# Patient Record
Sex: Female | Born: 1975 | Race: Black or African American | Hispanic: No | Smoking: Never smoker
Health system: Southern US, Community
[De-identification: ages and names within clinical notes are randomized; demographics above are authoritative.]

## PROBLEM LIST (undated history)

## (undated) ENCOUNTER — Inpatient Hospital Stay (HOSPITAL_COMMUNITY): Payer: Self-pay

## (undated) DIAGNOSIS — E282 Polycystic ovarian syndrome: Secondary | ICD-10-CM

## (undated) DIAGNOSIS — M1990 Unspecified osteoarthritis, unspecified site: Secondary | ICD-10-CM

## (undated) DIAGNOSIS — M5126 Other intervertebral disc displacement, lumbar region: Secondary | ICD-10-CM

## (undated) DIAGNOSIS — I10 Essential (primary) hypertension: Secondary | ICD-10-CM

## (undated) DIAGNOSIS — E119 Type 2 diabetes mellitus without complications: Secondary | ICD-10-CM

## (undated) DIAGNOSIS — Z5189 Encounter for other specified aftercare: Secondary | ICD-10-CM

## (undated) DIAGNOSIS — G5600 Carpal tunnel syndrome, unspecified upper limb: Secondary | ICD-10-CM

## (undated) HISTORY — PX: WISDOM TOOTH EXTRACTION: SHX21

---

## 2015-04-16 ENCOUNTER — Emergency Department (HOSPITAL_COMMUNITY)
Admission: EM | Admit: 2015-04-16 | Discharge: 2015-04-16 | Disposition: A | Discharge status: Home / Self Care | Payer: Self-pay | Attending: Emergency Medicine | Admitting: Emergency Medicine

## 2015-04-16 ENCOUNTER — Encounter (HOSPITAL_COMMUNITY): Payer: Self-pay | Admitting: *Deleted

## 2015-04-16 DIAGNOSIS — M79604 Pain in right leg: Secondary | ICD-10-CM

## 2015-04-16 DIAGNOSIS — I10 Essential (primary) hypertension: Secondary | ICD-10-CM | POA: Insufficient documentation

## 2015-04-16 DIAGNOSIS — E119 Type 2 diabetes mellitus without complications: Secondary | ICD-10-CM | POA: Insufficient documentation

## 2015-04-16 DIAGNOSIS — Z8669 Personal history of other diseases of the nervous system and sense organs: Secondary | ICD-10-CM | POA: Insufficient documentation

## 2015-04-16 DIAGNOSIS — M79661 Pain in right lower leg: Secondary | ICD-10-CM | POA: Insufficient documentation

## 2015-04-16 HISTORY — DX: Unspecified osteoarthritis, unspecified site: M19.90

## 2015-04-16 HISTORY — DX: Polycystic ovarian syndrome: E28.2

## 2015-04-16 HISTORY — DX: Essential (primary) hypertension: I10

## 2015-04-16 HISTORY — DX: Type 2 diabetes mellitus without complications: E11.9

## 2015-04-16 HISTORY — DX: Carpal tunnel syndrome, unspecified upper limb: G56.00

## 2015-04-16 HISTORY — DX: Other intervertebral disc displacement, lumbar region: M51.26

## 2015-04-16 HISTORY — DX: Encounter for other specified aftercare: Z51.89

## 2015-04-16 MED ORDER — HYDROCODONE-ACETAMINOPHEN 5-325 MG PO TABS: 1.0000 | ORAL_TABLET | Freq: Four times a day (QID) | ORAL | Status: DC | PRN

## 2015-04-16 MED ORDER — HYDROCODONE-ACETAMINOPHEN 5-325 MG PO TABS
1.0000 | ORAL_TABLET | Freq: Once | ORAL | Status: AC
  Administered 2015-04-16: 1 via ORAL
  Filled 2015-04-16: qty 1

## 2015-04-16 MED ORDER — HYDROCHLOROTHIAZIDE 25 MG PO TABS: 25.0000 mg | ORAL_TABLET | Freq: Every day | ORAL | Status: DC

## 2015-04-16 MED ORDER — ENOXAPARIN SODIUM 120 MG/0.8ML ~~LOC~~ SOLN
1.0000 mg/kg | Freq: Once | SUBCUTANEOUS | Status: AC
  Administered 2015-04-16: 120 mg via SUBCUTANEOUS
  Filled 2015-04-16: qty 0.8

## 2015-04-16 NOTE — ED Notes (Signed)
Pt stable, ambulatory, pain decreased to 4/10, states understanding of discharge instructions 

## 2015-04-16 NOTE — Discharge Instructions (Signed)
Please return to ER tomorrow for vascular doppler study of your right leg to rule out blood clot in your leg.  Use resources below to find a primary care provider for further management of your health.  Your blood pressure is high, take hydrochlorothiazide as prescribed and have it rechecked.     Emergency Department Resource Guide 1) Find a Doctor and Pay Out of Pocket Although you won't have to find out who is covered by your insurance plan, it is a good idea to ask around and get recommendations. You will then need to call the office and see if the doctor you have chosen will accept you as a new patient and what types of options they offer for patients who are self-pay. Some doctors offer discounts or will set up payment plans for their patients who do not have insurance, but you will need to ask so you aren't surprised when you get to your appointment.  2) Contact Your Local Health Department Not all health departments have doctors that can see patients for sick visits, but many do, so it is worth a call to see if yours does. If you don't know where your local health department is, you can check in your phone book. The CDC also has a tool to help you locate your state's health department, and many state websites also have listings of all of their local health departments.  3) Find a Walk-in Clinic If your illness is not likely to be very severe or complicated, you may want to try a walk in clinic. These are popping up all over the country in pharmacies, drugstores, and shopping centers. They're usually staffed by nurse practitioners or physician assistants that have been trained to treat common illnesses and complaints. They're usually fairly quick and inexpensive. However, if you have serious medical issues or chronic medical problems, these are probably not your best option.  No Primary Care Doctor: - Call Health Connect at  [phone] - they can help you locate a primary care doctor that  accepts  your insurance, provides certain services, etc. - Physician Referral Service- [phone]  Chronic Pain Problems: Organization         Address  Phone   Notes  [facility]  [phone] Patients need to be referred by their primary care doctor.   Medication Assistance: Organization         Address  Phone   Notes  [facility] [address] [place], [place] [address] [phone] --Must be a resident of [place] -- Must have NO insurance coverage whatsoever (no Medicaid/ Medicare, etc.) -- The pt. MUST have a primary care doctor that directs their care regularly and follows them in the community   MedAssist  [phone]   [employer]  [phone]    Agencies that provide inexpensive medical care: Organization         Address  Phone   Notes  [facility]  [phone]   [facility]    [phone]) [phone]   [facility] [address] [place], [place] [address] [phone]   [facility] [address], [place] [phone]) [phone]   Planned Parenthood    [phone]   [facility]    [phone]   [facility]  [address], [place] Phone:  [phone], Fax:  [phone] Hours of  Operation:  9 am - 6 pm, M-F.  Also accepts Medicaid/Medicare and self-pay.  [facility] for [place], [address], [place] Phone: [phone], Fax: [phone]. Hours of Operation:  8:30 am - 5:30 pm, M-F.  Also accepts Medicaid and self-pay.  [facility] [address], [place] Phone: [phone]   [place], [place], [place] [phone], Ext. [phone] Mondays & Thursdays: 7-9 AM.  First 15 patients are seen on a first come, first serve basis.    [place] Providers:  Organization          Address  Phone   Notes  [facility] [address], [place] [phone] Also accepts self-pay patients.  [facility] [address], [place]  [phone]   [place], [address], [place] [phone]   [facility] [address] [place] [phone]   Lucianne Lei [address], [place]   [phone] Only accepts [place] Access [place] patients after they have their name applied to their card.   Self-Pay (no insurance) in [facility]:  Organization         Address  Phone   Notes  Sickle Cell Patients, [facility] [place] [phone]   [facility] [place] [phone]   [facility] [place]  [place], [place], [place] [phone]   [facility]/Dr. Osei-Bonsu  [address] [place] or [address], [place] [phone] Phone number for both [place] and [place] locations is the same.  [facility] [address] [place] [phone]   [facility] [address], [place] or [address] [phone]   [facility] [address], [place] [phone], phone; [phone], fax Sees patients 1st and 3rd Saturday of every month.  Must not qualify for public or private insurance (i.e. Medicaid, Medicare, [place] Health Choice, Veterans' Benefits)  Household income should be no more than 200% of the poverty level The clinic cannot treat you if you are pregnant or think you are pregnant  Sexually transmitted diseases are not treated at the clinic.    Dental Care: Organization         Address  Phone  Notes  [facility] Department of [facility] [place] [phone] Accepts children up to age 47 who are enrolled in [place] or [place]; pregnant women with a Medicaid card; and children who have applied for Medicaid or [place] Health Choice, but were declined, whose parents can pay a reduced fee at time of service.  [facility] Department of [facility]  [address], [place] [phone] Accepts children up to age 82 who are enrolled in [place] or [place]; pregnant women with a Medicaid card; and children who have applied for Medicaid or [place] Health Choice, but were declined, whose parents can pay a reduced fee at time of service.  [facility] Adult [facility] Access PROGRAM  [place] [phone] Patients are seen by appointment only. Walk-ins are not accepted. [place] will see patients  36 years of age and older. Monday - Tuesday (8am-5pm) Most Wednesdays (8:30-5pm) $30 per visit, cash only  [facility] Adult Dental Access PROGRAM  [address], [facility] [phone] Patients are seen by appointment only. Walk-ins are not accepted. [place] will see patients 34 years of age and older. One Wednesday Evening (Monthly: Volunteer Based).  $30 per visit, cash only  [place]  [phone] for adults; Children under age 73, call Graduate Pediatric Dentistry at [phone]. Children aged 46-14, please call [phone] to request a pediatric application.  Dental services are provided in all areas of dental care including fillings, crowns and bridges, complete and partial dentures, implants, gum treatment, root canals, and extractions. Preventive care is also provided. Treatment is provided to both adults and children. Patients are selected via a lottery and there is often a waiting list.   [facility] [address] [place]  [phone] [email or web address]   [facility] [address] [place], [place]  [phone], Ext. [phone] Second and Fourth Thursday of each month, opens at 6:30 AM; Clinic ends at 9 AM.  Patients are seen on a first-come first-served basis, and a limited number are seen during each clinic.   [facility]  [address] [place]ard Danker [place], [place] [phone]   Eligibility Requirements You must have lived in [place], [place], or [place] counties for at least the last three months.   You cannot be eligible for state or federal sponsored [employer], including [employer], [place], or [employer].   You generally cannot be eligible for healthcare insurance through your employer.    How to apply: Eligibility screenings are held every Tuesday and Wednesday afternoon from 1:00 pm until 4:00 pm. You do not need an appointment for the interview!  [facility] [address] [place], [place]   [place]  [place] Department  [place]  [phone]    Behavioral Health Resources in the Community: Intensive Outpatient Programs Organization         Address  Phone  Notes  [facility] [place]. [address] [place], [place] [phone]   [facility] Outpatient [address], [place], [place]   ADS: Alcohol & Drug Svcs [address], [place], [place]   [place] [address]  [place], [place] or [phone]   Substance Abuse Resources Organization         Address  Phone  Notes  Alcohol and Drug Services  [phone]   [place]  [phone]   The [place]   Chinita Pester  [phone]   Residential & Outpatient Substance Abuse Program  [phone]   Psychological Services Organization         Address  Phone  Notes  [facility] [place]  [place]  [phone]    [place] [address], [place] or [phone]    Mobile Crisis Teams Organization         Address  Phone  Notes  [facility], Mobile Crisis Care Unit  [phone]   [facility]  [address] [place], [place]   [facility] [address] [place] [phone]    Self-Help/Support Groups Organization  Address  Phone             Notes  [place]. of [place] - variety of support groups  [place] Call for more information  Narcotics Anonymous (NA), [facility] [address], [employer] [place]  2 meetings at this location   Special educational needs teacher         Address  Phone  Notes  [facility]idential [facility] [place],    [place]  [phone]   [facility]  [address] [place] [address], [place], [place]   [place], [place] [phone] Admissions: 8am-3pm M-F  Incentives Substance [place] [address],    [place], [place] [phone]   [facility] [address] [place], [place], [place]   [facility] [address]  [place], [place]   Insight Programs - Intensive Outpatient [address], [place], [place]   [facility] ([place].,  [place], [place] [phone] or [phone]   [facility] Services (RTS) [address] [place], [place] Accepts Medicaid  [facility] [place] [address]  [place] [phone] Substance Abuse/Addiction Treatment   [facility] Organization         Address  Phone  Notes  [facility]  [phone]   Domenic Schwab, PhD [address] [place]lis [place], [place]   [phone] or [phone]   [place]  [place], [place] [phone]   [facility] [address] [place], [place] [phone] Insurance/Medicaid/sponsorship through [facility] and Families [address] [place]                                    [place], [place] [phone] [place] [address], [place] [phone]    Dr. Adele Schilder  [phone]   [facility] [place] Dept. 1) [address], [place] 2) [place] 3)  [place] [address], [place] [phone]  [phone]   [place] [phone] or [phone] (After Hours)

## 2015-04-16 NOTE — ED Notes (Signed)
Called for BP recheck. No answer.

## 2015-04-16 NOTE — ED Notes (Signed)
Pt states that she "feels something warm running down the inside of my right leg." pt also c/o knot on her head x 2 years. States that it is getting bigger. Pt states she is concerned for a blood clot in her leg.

## 2015-04-16 NOTE — ED Notes (Signed)
Also c/o left hip pain at times.

## 2015-04-16 NOTE — ED Provider Notes (Signed)
CSN: [id number]     Arrival date & time 04/16/15  1726 History   First MD Initiated Contact with Patient 04/16/15 2023     Chief Complaint  Patient presents with  . Leg Pain     (Consider location/radiation/quality/duration/timing/severity/associated sxs/prior Treatment) HPI   39 year old female with history of PCOS, diabetes, hypertension presenting for evaluation of R leg pain.  Patient complaining of recurrent pain to her right posterior knee and right calf for the past 3 months. Pain is described as a dull achy sensation with occasional sharp pain. She also endorsed intermittent numbness sensation to her knee and leg. She felt that for the past 5 days her pain has increased in intensity. Occasionally patient report "I feel something while running down the inside of my leg and I am concerning of blood clot". She she travels extensively, driving all around town and occasionally driving long distances. Report that she had a 15 Hour drive last week. She also have history of diabetes and hypertension which she does not take any medication for. Reported history of arthritis, degenerative disc disease and back pain. She has not tried anything to help with her symptoms. She never had blood clot in her legs before however she is overly concerned about clots because a friend of her died from a blood clot recently.  She also mentioned she is scheduled for a long trip (40 hrs drive) in the next few days and want to ensure she doesn't have blood clot.  Denies recent surgery, prolonged bed rest, active cancer, increase SOB, or hemoptysis.    Further more pt has a knot on the top of her scalp that has increased in size for the past several years.  It is occasionally painful.  Denies severe headache, fever, or rash.  Denies any injury.  sts knot changes size (sometimes large then became small).     Past Medical History  Diagnosis Date  . Blood transfusion without reported diagnosis   . Diabetes mellitus  without complication   . Hypertension   . Arthritis   . Lumbar herniated disc   . DJD (degenerative joint disease)   . PCOS (polycystic ovarian syndrome)   . Carpal tunnel syndrome    History reviewed. No pertinent past surgical history. No family history on file. History  Substance Use Topics  . Smoking status: Never Smoker   . Smokeless tobacco: Not on file  . Alcohol Use: No   OB History    No data available     Review of Systems  All other systems reviewed and are negative.     Allergies  Lyrica; Ultracet; and Ultram  Home Medications   Prior to Admission medications   Not on File   BP 206/114 mmHg  Pulse 78  Temp(Src) 98.4 F (36.9 C)  Resp 18  Wt 265 lb 4 oz (120.317 kg)  SpO2 97%  LMP 03/26/2015 Physical Exam  Constitutional: She is oriented to person, place, and time. She appears well-developed and well-nourished. No distress.  Moderately obese American female, nontoxic in appearance  HENT:  Head: Atraumatic.  A quarter size non-mobile nodule noted to the vertex of scalp, no surrounding erythema and erythema, mild tenderness to palpation. No alopecia.  Eyes: Conjunctivae are normal.  Neck: Neck supple.  Cardiovascular: Normal rate and regular rhythm.   Pulmonary/Chest: Effort normal and breath sounds normal.  Abdominal: Soft. There is no tenderness.  Musculoskeletal: She exhibits tenderness (right lower extremity: Mild tenderness to popliteal fossa and right calf  without evidence of Baker's cyst, edema, palpable cord, or erythema. Normal knee flexion and extension. Intact distal pedal pulses.).  Neurological: She is alert and oriented to person, place, and time. She has normal strength. No cranial nerve deficit or sensory deficit. She displays a negative Romberg sign. Coordination and gait normal. GCS eye subscore is 4. GCS verbal subscore is 5. GCS motor subscore is 6.  Skin: No rash noted.  Psychiatric: She has a normal mood and affect.  Nursing note  and vitals reviewed.   ED Course  Procedures (including critical care time)  Patient here with concern of right knee and right leg pain that has been persistent for at least 3 months. She is concerning of possible DVT due to prolonged travel on a regular basis. On exam patient has no significant finding to suggest DVT. Although I have low suspicion for DVT patient is very concerned because of a friend recently died from a blood clot that was undiagnosed. Given her risk factors, vascular Doppler ultrasound will be ordered for tomorrow. Patient will receive Lovenox tonight. She also has elevated blood pressure with systolic above 200. She has been noncompliant with her medication and therefore I'll prescribe hydrochlorothiazide and provide results of resource for patient to follow-up closely. Return precautions discussed.  Labs Review Labs Reviewed - No data to display  Imaging Review No results found.   EKG Interpretation None      MDM   Final diagnoses:  Right leg pain    BP 206/110 mmHg  Pulse 50  Temp(Src) 98.4 F (36.9 C) (Oral)  Resp 17  Ht 5\' 10"  (1.778 m)  Wt 265 lb 4 oz (120.317 kg)  BMI 38.06 kg/m2  SpO2 100%  LMP 03/26/2015     Fayrene HelperBowie Jmya Uliano, PA-C 04/16/15 2259  Benjiman CoreNathan Pickering, MD 04/19/15 [phone]

## 2015-04-17 ENCOUNTER — Ambulatory Visit (HOSPITAL_COMMUNITY)
Admission: RE | Admit: 2015-04-17 | Discharge: 2015-04-17 | Disposition: A | Discharge status: Home / Self Care | Payer: Self-pay | Source: Ambulatory Visit | Attending: Emergency Medicine | Admitting: Emergency Medicine

## 2015-04-17 DIAGNOSIS — M79604 Pain in right leg: Secondary | ICD-10-CM | POA: Insufficient documentation

## 2015-04-17 NOTE — Progress Notes (Signed)
VASCULAR LAB PRELIMINARY  PRELIMINARY  PRELIMINARY  PRELIMINARY  Right lower extremity venous duplex completed.    Preliminary report:  Right:  No evidence of DVT, superficial thrombosis, or Baker's cyst.   Schelly Chuba, RVS 04/17/2015, 1:11 PM

## 2015-04-22 ENCOUNTER — Encounter (HOSPITAL_COMMUNITY): Payer: Self-pay | Admitting: *Deleted

## 2015-04-22 ENCOUNTER — Emergency Department (HOSPITAL_COMMUNITY)
Admission: EM | Admit: 2015-04-22 | Discharge: 2015-04-22 | Disposition: A | Discharge status: Home / Self Care | Payer: Self-pay | Attending: Emergency Medicine | Admitting: Emergency Medicine

## 2015-04-22 DIAGNOSIS — M1990 Unspecified osteoarthritis, unspecified site: Secondary | ICD-10-CM | POA: Insufficient documentation

## 2015-04-22 DIAGNOSIS — I10 Essential (primary) hypertension: Secondary | ICD-10-CM | POA: Insufficient documentation

## 2015-04-22 DIAGNOSIS — T148XXA Other injury of unspecified body region, initial encounter: Secondary | ICD-10-CM

## 2015-04-22 DIAGNOSIS — Y848 Other medical procedures as the cause of abnormal reaction of the patient, or of later complication, without mention of misadventure at the time of the procedure: Secondary | ICD-10-CM | POA: Insufficient documentation

## 2015-04-22 DIAGNOSIS — L0291 Cutaneous abscess, unspecified: Secondary | ICD-10-CM

## 2015-04-22 DIAGNOSIS — Z79899 Other long term (current) drug therapy: Secondary | ICD-10-CM | POA: Insufficient documentation

## 2015-04-22 DIAGNOSIS — T8089XA Other complications following infusion, transfusion and therapeutic injection, initial encounter: Secondary | ICD-10-CM | POA: Insufficient documentation

## 2015-04-22 DIAGNOSIS — Z8669 Personal history of other diseases of the nervous system and sense organs: Secondary | ICD-10-CM | POA: Insufficient documentation

## 2015-04-22 DIAGNOSIS — E119 Type 2 diabetes mellitus without complications: Secondary | ICD-10-CM | POA: Insufficient documentation

## 2015-04-22 LAB — BASIC METABOLIC PANEL
ANION GAP: 7 (ref 5–15)
BUN: 9 mg/dL (ref 6–20)
CO2: 27 mmol/L (ref 22–32)
Calcium: 8.8 mg/dL — ABNORMAL LOW (ref 8.9–10.3)
Chloride: 104 mmol/L (ref 101–111)
Creatinine, Ser: 0.72 mg/dL (ref 0.44–1.00)
GFR calc Af Amer: >60 mL/min (ref >60)
GFR calc non Af Amer: >60 mL/min (ref >60)
Glucose, Bld: 114 mg/dL — ABNORMAL HIGH (ref 70–99)
POTASSIUM: 4.2 mmol/L (ref 3.5–5.1)
SODIUM: 138 mmol/L (ref 135–145)

## 2015-04-22 LAB — CBC WITH DIFFERENTIAL/PLATELET
Basophils Absolute: 0 10*3/uL (ref 0.0–0.1)
Basophils Relative: 0 % (ref 0–1)
EOS ABS: 0.1 10*3/uL (ref 0.0–0.7)
EOS PCT: 1 % (ref 0–5)
HEMATOCRIT: 33.8 % — AB (ref 36.0–46.0)
HEMOGLOBIN: 11.4 g/dL — AB (ref 12.0–15.0)
LYMPHS ABS: 3 10*3/uL (ref 0.7–4.0)
Lymphocytes Relative: 43 % (ref 12–46)
MCH: 28.1 pg (ref 26.0–34.0)
MCHC: 33.7 g/dL (ref 30.0–36.0)
MCV: 83.3 fL (ref 78.0–100.0)
MONOS PCT: 5 % (ref 3–12)
Monocytes Absolute: 0.3 10*3/uL (ref 0.1–1.0)
NEUTROS PCT: 51 % (ref 43–77)
Neutro Abs: 3.6 10*3/uL (ref 1.7–7.7)
Platelets: 265 10*3/uL (ref 150–400)
RBC: 4.06 MIL/uL (ref 3.87–5.11)
RDW: 13 % (ref 11.5–15.5)
WBC: 7 10*3/uL (ref 4.0–10.5)

## 2015-04-22 MED ORDER — HYDROCODONE-ACETAMINOPHEN 5-325 MG PO TABS
1.0000 | ORAL_TABLET | Freq: Once | ORAL | Status: AC
  Administered 2015-04-22: 1 via ORAL
  Filled 2015-04-22: qty 1

## 2015-04-22 MED ORDER — HYDROCODONE-ACETAMINOPHEN 5-325 MG PO TABS: 1.0000 | ORAL_TABLET | Freq: Four times a day (QID) | ORAL | Status: DC | PRN

## 2015-04-22 MED ORDER — CEPHALEXIN 500 MG PO CAPS: ORAL_CAPSULE | ORAL | Status: DC

## 2015-04-22 MED ORDER — SULFAMETHOXAZOLE-TRIMETHOPRIM 800-160 MG PO TABS: 1.0000 | ORAL_TABLET | Freq: Two times a day (BID) | ORAL | Status: DC

## 2015-04-22 NOTE — ED Notes (Signed)
Pt reports being seen her on the 27th for a fall. Pt sates that she was given a "shot of a blood thinner" until she could have a doppler study done the next day. Pt states the she has bruising, reddness, heat and tenderness. Pt states that there is a burning sensation.

## 2015-04-22 NOTE — Discharge Instructions (Signed)
Keep area clean and dry. Apply warm compresses to affected area throughout the day. Take antibiotics until it is finished. Take norco as directed, as needed for pain but do not drive or operate machinery with pain medication use. Followup with [facility] in 7 days for wound recheck. Monitor area for signs of infection to include, but not limited to: increasing pain, redness, drainage/pus, or swelling. Return to emergency department for emergent changing or worsening symptoms.   Abscess An abscess (boil or furuncle) is an infected area on or under the skin. This area is filled with yellowish-white fluid (pus) and other material (debris). HOME CARE   Only take medicines as told by your doctor.  If you were given antibiotic medicine, take it as directed. Finish the medicine even if you start to feel better.  If gauze is used, follow your doctor's directions for changing the gauze.  To avoid spreading the infection:  Keep your abscess covered with a bandage.  Wash your hands well.  Do not share personal care items, towels, or whirlpools with others.  Avoid skin contact with others.  Keep your skin and clothes clean around the abscess.  Keep all doctor visits as told. GET HELP RIGHT AWAY IF:   You have more pain, puffiness (swelling), or redness in the wound site.  You have more fluid or blood coming from the wound site.  You have muscle aches, chills, or you feel sick.  You have a fever. MAKE SURE YOU:   Understand these instructions.  Will watch your condition.  Will get help right away if you are not doing well or get worse. Document Released: 05/24/2008 Document Revised: 06/06/2012 Document Reviewed: 02/18/2012 [facility] Patient Information 2015 [place], [place]. This information is not intended to replace advice given to you by your health care provider. Make sure you discuss any questions you have with your health care provider.  Contusion A contusion is a  deep bruise. Contusions happen when an injury causes bleeding under the skin. Signs of bruising include pain, puffiness (swelling), and discolored skin. The contusion may turn blue, purple, or yellow. HOME CARE   Put ice on the injured area.  Put ice in a plastic bag.  Place a towel between your skin and the bag.  Leave the ice on for 15-20 minutes, 03-04 times a day.  Only take medicine as told by your doctor.  Rest the injured area.  If possible, raise (elevate) the injured area to lessen puffiness. GET HELP RIGHT AWAY IF:   You have more bruising or puffiness.  You have pain that is getting worse.  Your puffiness or pain is not helped by medicine. MAKE SURE YOU:   Understand these instructions.  Will watch your condition.  Will get help right away if you are not doing well or get worse. Document Released: 05/24/2008 Document Revised: 02/28/2012 Document Reviewed: 10/11/2011 [facility] Patient Information 2015 [place], [place]. This information is not intended to replace advice given to you by your health care provider. Make sure you discuss any questions you have with your health care provider.  Cryotherapy Cryotherapy means treatment with cold. Ice or gel packs can be used to reduce both pain and swelling. Ice is the most helpful within the first 24 to 48 hours after an injury or flare-up from overusing a muscle or joint. Sprains, strains, spasms, burning pain, shooting pain, and aches can all be eased with ice. Ice can also be used when recovering from surgery. Ice is effective,  has very few side effects, and is safe for most people to use. PRECAUTIONS  Ice is not a safe treatment option for people with:  Raynaud phenomenon. This is a condition affecting small blood vessels in the extremities. Exposure to cold may cause your problems to return.  Cold hypersensitivity. There are many forms of cold hypersensitivity, including:  Cold urticaria. Red, itchy hives appear on the  skin when the tissues begin to warm after being iced.  Cold erythema. This is a red, itchy rash caused by exposure to cold.  Cold hemoglobinuria. Red blood cells break down when the tissues begin to warm after being iced. The hemoglobin that carry oxygen are passed into the urine because they cannot combine with blood proteins fast enough.  Numbness or altered sensitivity in the area being iced. If you have any of the following conditions, do not use ice until you have discussed cryotherapy with your caregiver:  Heart conditions, such as arrhythmia, angina, or chronic heart disease.  High blood pressure.  Healing wounds or open skin in the area being iced.  Current infections.  Rheumatoid arthritis.  Poor circulation.  Diabetes. Ice slows the blood flow in the region it is applied. This is beneficial when trying to stop inflamed tissues from spreading irritating chemicals to surrounding tissues. However, if you expose your skin to cold temperatures for too long or without the proper protection, you can damage your skin or nerves. Watch for signs of skin damage due to cold. HOME CARE INSTRUCTIONS Follow these tips to use ice and cold packs safely.  Place a dry or damp towel between the ice and skin. A damp towel will cool the skin more quickly, so you may need to shorten the time that the ice is used.  For a more rapid response, add gentle compression to the ice.  Ice for no more than 10 to 20 minutes at a time. The bonier the area you are icing, the less time it will take to get the benefits of ice.  Check your skin after 5 minutes to make sure there are no signs of a poor response to cold or skin damage.  Rest 20 minutes or more between uses.  Once your skin is numb, you can end your treatment. You can test numbness by very lightly touching your skin. The touch should be so light that you do not see the skin dimple from the pressure of your fingertip. When using ice, most people  will feel these normal sensations in this order: cold, burning, aching, and numbness.  Do not use ice on someone who cannot communicate their responses to pain, such as small children or people with dementia. HOW TO MAKE AN ICE PACK Ice packs are the most common way to use ice therapy. Other methods include ice massage, ice baths, and cryosprays. Muscle creams that cause a cold, tingly feeling do not offer the same benefits that ice offers and should not be used as a substitute unless recommended by your caregiver. To make an ice pack, do one of the following:  Place crushed ice or a bag of frozen vegetables in a sealable plastic bag. Squeeze out the excess air. Place this bag inside another plastic bag. Slide the bag into a pillowcase or place a damp towel between your skin and the bag.  Mix 3 parts water with 1 part rubbing alcohol. Freeze the mixture in a sealable plastic bag. When you remove the mixture from the freezer, it will be slushy.  Squeeze out the excess air. Place this bag inside another plastic bag. Slide the bag into a pillowcase or place a damp towel between your skin and the bag. SEEK MEDICAL CARE IF:  You develop white spots on your skin. This may give the skin a blotchy (mottled) appearance.  Your skin turns blue or pale.  Your skin becomes waxy or hard.  Your swelling gets worse. MAKE SURE YOU:   Understand these instructions.  Will watch your condition.  Will get help right away if you are not doing well or get worse. Document Released: 08/02/2011 Document Revised: 04/22/2014 Document Reviewed: 08/02/2011 [facility] Patient Information 2015 [place], [place]. This information is not intended to replace advice given to you by your health care provider. Make sure you discuss any questions you have with your health care provider.  Hematoma A hematoma is a collection of blood under the skin, in an organ, in a body space, in a joint space, or in other tissue. The blood can  clot to form a lump that you can see and feel. The lump is often firm and may sometimes become sore and tender. Most hematomas get better in a few days to weeks. However, some hematomas may be serious and require medical care. Hematomas can range in size from very small to very large. CAUSES  A hematoma can be caused by a blunt or penetrating injury. It can also be caused by spontaneous leakage from a blood vessel under the skin. Spontaneous leakage from a blood vessel is more likely to occur in older people, especially those taking blood thinners. Sometimes, a hematoma can develop after certain medical procedures. SIGNS AND SYMPTOMS   A firm lump on the body.  Possible pain and tenderness in the area.  Bruising.Blue, dark blue, purple-red, or yellowish skin may appear at the site of the hematoma if the hematoma is close to the surface of the skin. For hematomas in deeper tissues or body spaces, the signs and symptoms may be subtle. For example, an intra-abdominal hematoma may cause abdominal pain, weakness, fainting, and shortness of breath. An intracranial hematoma may cause a headache or symptoms such as weakness, trouble speaking, or a change in consciousness. DIAGNOSIS  A hematoma can usually be diagnosed based on your medical history and a physical exam. Imaging tests may be needed if your health care provider suspects a hematoma in deeper tissues or body spaces, such as the abdomen, head, or chest. These tests may include ultrasonography or a CT scan.  TREATMENT  Hematomas usually go away on their own over time. Rarely does the blood need to be drained out of the body. Large hematomas or those that may affect vital organs will sometimes need surgical drainage or monitoring. HOME CARE INSTRUCTIONS   Apply ice to the injured area:   Put ice in a plastic bag.   Place a towel between your skin and the bag.   Leave the ice on for 20 minutes, 2-3 times a day for the first 1 to 2 days.    After the first 2 days, switch to using warm compresses on the hematoma.   Elevate the injured area to help decrease pain and swelling. Wrapping the area with an elastic bandage may also be helpful. Compression helps to reduce swelling and promotes shrinking of the hematoma. Make sure the bandage is not wrapped too tight.   If your hematoma is on a lower extremity and is painful, crutches may be helpful for a couple days.  Only take over-the-counter or prescription medicines as directed by your health care provider. SEEK IMMEDIATE MEDICAL CARE IF:   You have increasing pain, or your pain is not controlled with medicine.   You have a fever.   You have worsening swelling or discoloration.   Your skin over the hematoma breaks or starts bleeding.   Your hematoma is in your chest or abdomen and you have weakness, shortness of breath, or a change in consciousness.  Your hematoma is on your scalp (caused by a fall or injury) and you have a worsening headache or a change in alertness or consciousness. MAKE SURE YOU:   Understand these instructions.  Will watch your condition.  Will get help right away if you are not doing well or get worse. Document Released: 07/20/2004 Document Revised: 08/08/2013 Document Reviewed: 05/16/2013 [facility] Patient Information 2015 [place], [place]. This information is not intended to replace advice given to you by your health care provider. Make sure you discuss any questions you have with your health care provider.

## 2015-04-22 NOTE — ED Provider Notes (Signed)
CSN: [id number]     Arrival date & time 04/22/15  1355 History   First MD Initiated Contact with Patient 04/22/15 1511     Chief Complaint  Patient presents with  . Bleeding/Bruising     (Consider location/radiation/quality/duration/timing/severity/associated sxs/prior Treatment) HPI Comments: Latoya Strickland is a 39 y.o. female with a PMHx of HTN, DM2, DJD, PCOS, and carpal tunnel syndrome, who presents to the ED with complaints of bruising around the area where she received a Lovenox shot on her R lateral abdomen/love-handle area/hip. She reports she received the shot on 4/27 for concerns of a DVT, and had a negative DVT study the day after. Subsequently she noticed worsening bruising to the area, and a small amount of erythema and swelling with induration to the center of the bruise. She reports the pain to this area is a 10/10 soreness which is constant and nonradiating, worse with palpation, with no medications tried prior to arrival. She endorses associated swelling, induration, warmth, and erythema. She reports that the bruise has stopped spreading, but became quite large before it stopped. She denies any fevers, chills, chest pain, shortness breath, cough, hemoptysis, abdominal pain, nausea, vomiting, diarrhea, dysuria, hematuria, vaginal bleeding or discharge, melena, hematochezia, numbness, tingling, weakness, dizziness, or syncope.  Patient is a 39 y.o. female presenting with general illness. The history is provided by the patient. No language interpreter was used.  Illness Location:  R lateral abdomen/love-handle area/hip Quality:  Soreness Severity:  Severe Onset quality:  Gradual Duration:  1 week Timing:  Constant Progression:  Unchanged Chronicity:  New Context:  After lovenox shot Relieved by:  None tried Worsened by:  Palpation to the area Ineffective treatments:  None tried Associated symptoms: no abdominal pain, no chest pain, no diarrhea, no fever, no myalgias, no nausea,  no shortness of breath and no vomiting   Risk factors:  Diabetic   Past Medical History  Diagnosis Date  . Blood transfusion without reported diagnosis   . Diabetes mellitus without complication   . Hypertension   . Arthritis   . Lumbar herniated disc   . DJD (degenerative joint disease)   . PCOS (polycystic ovarian syndrome)   . Carpal tunnel syndrome    History reviewed. No pertinent past surgical history. No family history on file. History  Substance Use Topics  . Smoking status: Never Smoker   . Smokeless tobacco: Not on file  . Alcohol Use: No   OB History    No data available     Review of Systems  Constitutional: Negative for fever and chills.  Respiratory: Negative for shortness of breath.   Cardiovascular: Negative for chest pain.  Gastrointestinal: Negative for nausea, vomiting, abdominal pain, diarrhea, constipation and blood in stool.  Genitourinary: Negative for dysuria, hematuria, vaginal bleeding and vaginal discharge.  Musculoskeletal: Negative for myalgias and arthralgias.  Skin: Positive for color change (bruise to R hip/love handle).  Allergic/Immunologic: Positive for immunocompromised state (diabetic).  Neurological: Negative for weakness and numbness.  Hematological: Does not bruise/bleed easily.  Psychiatric/Behavioral: Negative for confusion.   10 Systems reviewed and are negative for acute change except as noted in the HPI.    Allergies  Lyrica; Ultracet; and Ultram  Home Medications   Prior to Admission medications   Medication Sig Start Date End Date Taking? Authorizing Provider  hydrochlorothiazide (HYDRODIURIL) 25 MG tablet Take 1 tablet (25 mg total) by mouth daily. 04/16/15   Fayrene HelperBowie Tran, PA-C  HYDROcodone-acetaminophen (NORCO/VICODIN) 5-325 MG per tablet Take 1 tablet by  mouth every 6 (six) hours as needed for moderate pain. 04/16/15   Fayrene Helper, PA-C   BP 199/90 mmHg  Pulse 62  Temp(Src) 98.3 F (36.8 C) (Oral)  Resp 18  Ht 5'  10" (1.778 m)  Wt 265 lb (120.203 kg)  BMI 38.02 kg/m2  SpO2 97%  LMP 03/26/2015 Physical Exam  Constitutional: She is oriented to person, place, and time. Vital signs are normal. She appears well-developed and well-nourished.  Non-toxic appearance. No distress.  Afebrile, nontoxic, NAD. Chronic HTN noted  HENT:  Head: Normocephalic and atraumatic.  Mouth/Throat: Oropharynx is clear and moist and mucous membranes are normal.  Eyes: Conjunctivae and EOM are normal. Right eye exhibits no discharge. Left eye exhibits no discharge.  Neck: Normal range of motion. Neck supple.  Cardiovascular: Normal rate, regular rhythm, normal heart sounds and intact distal pulses.  Exam reveals no gallop and no friction rub.   No murmur heard. Pulmonary/Chest: Effort normal and breath sounds normal. No respiratory distress. She has no decreased breath sounds. She has no wheezes. She has no rhonchi. She has no rales.  Abdominal: Soft. Normal appearance and bowel sounds are normal. She exhibits no distension. There is no tenderness. There is no rigidity, no rebound, no guarding, no CVA tenderness, no tenderness at McBurney's point and negative Murphy's sign.  Soft, NTND, +BS throughout, no r/g/r, neg murphy's, neg mcburney's, no CVA TTP   Skin overlying R hip/love handle region with large hematoma (SEE PICTURE BELOW). Mild induration and tenderness at injection site in the middle of the hematoma, with slight erythema and warmth to this area. No fluctuance or drainage. No red streaking.   Musculoskeletal: Normal range of motion.  MAE x4 Strength and sensation grossly intact Distal pulses intact No pedal edema  R hip with FROM intact, nonTTP without crepitus or deformity.   Neurological: She is alert and oriented to person, place, and time. She has normal strength. No sensory deficit.  Skin: Skin is warm, dry and intact. Bruising noted. No rash noted. There is erythema.  Large hematoma to R hip/love-handle  region as noted below in the image, and erythema/warmth to center of hematoma as noted above.  Psychiatric: She has a normal mood and affect.  Nursing note and vitals reviewed.     ED Course  Procedures (including critical care time) Labs Review Labs Reviewed  CBC WITH DIFFERENTIAL/PLATELET - Abnormal; Notable for the following:    Hemoglobin 11.4 (*)    HCT 33.8 (*)    All other components within normal limits  BASIC METABOLIC PANEL - Abnormal; Notable for the following:    Glucose, Bld 114 (*)    Calcium 8.8 (*)    All other components within normal limits    Imaging Review No results found. DVT study 04/17/15: PRELIMINARY Right lower extremity venous duplex completed.   Preliminary report: Right: No evidence of DVT, superficial thrombosis, or Baker's cyst.   SLAUGHTER, VIR[place], RVS 04/17/2015, 1:11 PM    EKG Interpretation None      MDM   Final diagnoses:  Abscess  Hematoma    39 y.o. female with large hematoma at the site of lovenox shot. Also some induration and erythema, mild warmth to the area of injection site. Could be possible infection, no fluctuance or abscess visible. Pt very concerned that her blood work be done, will get basic labs to eval PLT counts and basic lab values. Will give pain meds now. Will likely send home with antibiotics to treat for  infection. Will reassess shortly.   5:50 PM Pain improved. Labs WNL. Will treat as early abscess and hematoma. Discussed heat/ice compresses. Will give keflex and bactrim. Will have her f/up with [facility] in 1 wk. Will give pain control. I explained the diagnosis and have given explicit precautions to return to the ER including for any other new or worsening symptoms. The patient understands and accepts the medical plan as it's been dictated and I have answered their questions. Discharge instructions concerning home care and prescriptions have been given. The patient is STABLE and is discharged to home in good  condition.  BP 182/76 mmHg  Pulse 60  Temp(Src) 98.3 F (36.8 C) (Oral)  Resp 16  Ht  (1.778 m)  Wt 265 lb (120.203 kg)  BMI 38.02 kg/m2  SpO2 98%  LMP 03/26/2015  Meds ordered this encounter  Medications  . HYDROcodone-acetaminophen (NORCO/VICODIN) 5-325 MG per tablet 1 tablet    Sig:   . cephALEXin (KEFLEX) 500 MG capsule    Sig: 2 caps po bid x 7 days    Dispense:  28 capsule    Refill:  0    Order Specific Question:  Supervising Provider    Answer:  MILLER, BRIAN [[id number]]  . sulfamethoxazole-trimethoprim (BACTRIM DS,SEPTRA DS) 800-160 MG per tablet    Sig: Take 1 tablet by mouth 2 (two) times daily.    Dispense:  14 tablet    Refill:  0    Order Specific Question:  Supervising Provider    Answer:  MILLER, BRIAN [[id number]]  . HYDROcodone-acetaminophen (NORCO) 5-325 MG per tablet    Sig: Take 1 tablet by mouth every 6 (six) hours as needed for severe pain.    Dispense:  10 tablet    Refill:  0    Order Specific Question:  Supervising Provider    Answer:  Eber Hong [[id number]]      Abuk Selleck Camprubi-Soms, PA-C 04/22/15 1751  Tilden Fossa, MD 04/23/15 [phone]

## 2015-12-17 ENCOUNTER — Emergency Department (HOSPITAL_COMMUNITY)
Admission: EM | Admit: 2015-12-17 | Discharge: 2015-12-17 | Disposition: A | Discharge status: Home / Self Care | Payer: Self-pay | Attending: Emergency Medicine | Admitting: Emergency Medicine

## 2015-12-17 ENCOUNTER — Encounter (HOSPITAL_COMMUNITY): Payer: Self-pay | Admitting: *Deleted

## 2015-12-17 DIAGNOSIS — E119 Type 2 diabetes mellitus without complications: Secondary | ICD-10-CM | POA: Insufficient documentation

## 2015-12-17 DIAGNOSIS — Z8669 Personal history of other diseases of the nervous system and sense organs: Secondary | ICD-10-CM | POA: Insufficient documentation

## 2015-12-17 DIAGNOSIS — I10 Essential (primary) hypertension: Secondary | ICD-10-CM | POA: Insufficient documentation

## 2015-12-17 DIAGNOSIS — K0889 Other specified disorders of teeth and supporting structures: Secondary | ICD-10-CM | POA: Insufficient documentation

## 2015-12-17 DIAGNOSIS — L723 Sebaceous cyst: Secondary | ICD-10-CM | POA: Insufficient documentation

## 2015-12-17 DIAGNOSIS — Z79899 Other long term (current) drug therapy: Secondary | ICD-10-CM | POA: Insufficient documentation

## 2015-12-17 DIAGNOSIS — Z8739 Personal history of other diseases of the musculoskeletal system and connective tissue: Secondary | ICD-10-CM | POA: Insufficient documentation

## 2015-12-17 MED ORDER — OXYCODONE-ACETAMINOPHEN 5-325 MG PO TABS: 1.0000 | ORAL_TABLET | Freq: Four times a day (QID) | ORAL | Status: DC | PRN

## 2015-12-17 MED ORDER — CLINDAMYCIN HCL 150 MG PO CAPS: ORAL_CAPSULE | ORAL | Status: DC

## 2015-12-17 MED ORDER — CLONIDINE HCL 0.1 MG PO TABS
0.1000 mg | ORAL_TABLET | Freq: Once | ORAL | Status: AC
  Administered 2015-12-17: 0.1 mg via ORAL
  Filled 2015-12-17: qty 1

## 2015-12-17 MED ORDER — OXYCODONE-ACETAMINOPHEN 5-325 MG PO TABS
1.0000 | ORAL_TABLET | Freq: Once | ORAL | Status: AC
  Administered 2015-12-17: 1 via ORAL
  Filled 2015-12-17: qty 1

## 2015-12-17 NOTE — ED Notes (Addendum)
Pt reports rt upper tooth pain that has been ongoing for 1 year but states that it has worsened. Pt also reports a knot on the top of her head. Pt states that she has not taken her BP medications in 2 days.

## 2015-12-17 NOTE — Discharge Instructions (Signed)
Follow-up with a dentist the next week. Follow-up with Dr. [facility] for the cyst on top your head. Take your blood pressure medicine and follow-up with family doctor for your blood pressure

## 2015-12-17 NOTE — ED Notes (Signed)
Pt is in stable condition upon d/c and ambulates from ED. 

## 2015-12-17 NOTE — ED Provider Notes (Signed)
CSN: [id number]     Arrival date & time 12/17/15  0800 History   First MD Initiated Contact with Patient 12/17/15 [phone]     Chief Complaint  Patient presents with  . Dental Pain     (Consider location/radiation/quality/duration/timing/severity/associated sxs/prior Treatment) Patient is a 39 y.o. female presenting with tooth pain. The history is provided by the patient (The patient complains of a toothache. Also she has a swelling to the top of her head that tender).  Dental Pain Location:  Upper Upper teeth location: Right upper molar. Quality:  Dull and localized Severity:  Moderate Onset quality:  Sudden Timing:  Constant Progression:  Worsening Chronicity:  Recurrent Associated symptoms: no congestion and no headaches     Past Medical History  Diagnosis Date  . Blood transfusion without reported diagnosis   . Diabetes mellitus without complication (HCC)   . Hypertension   . Arthritis   . Lumbar herniated disc   . DJD (degenerative joint disease)   . PCOS (polycystic ovarian syndrome)   . Carpal tunnel syndrome    History reviewed. No pertinent past surgical history. No family history on file. Social History  Substance Use Topics  . Smoking status: Never Smoker   . Smokeless tobacco: None  . Alcohol Use: No   OB History    No data available     Review of Systems  Constitutional: Negative for appetite change and fatigue.  HENT: Negative for congestion, ear discharge and sinus pressure.        Toothache. And tenderness to top of head  Eyes: Negative for discharge.  Respiratory: Negative for cough.   Cardiovascular: Negative for chest pain.  Gastrointestinal: Negative for abdominal pain and diarrhea.  Genitourinary: Negative for frequency and hematuria.  Musculoskeletal: Negative for back pain.  Skin: Negative for rash.  Neurological: Negative for seizures and headaches.  Psychiatric/Behavioral: Negative for hallucinations.      Allergies  Lyrica;  Ultracet; and Ultram  Home Medications   Prior to Admission medications   Medication Sig Start Date End Date Taking? Authorizing Provider  cephALEXin (KEFLEX) 500 MG capsule 2 caps po bid x 7 days 04/22/15   Mercedes Camprubi-Soms, PA-C  clindamycin (CLEOCIN) 150 MG capsule Take 2 tablets every 6 hours 12/17/15   Bethann Berkshire, MD  hydrochlorothiazide (HYDRODIURIL) 25 MG tablet Take 1 tablet (25 mg total) by mouth daily. 04/16/15   Fayrene Helper, PA-C  HYDROcodone-acetaminophen (NORCO) 5-325 MG per tablet Take 1 tablet by mouth every 6 (six) hours as needed for severe pain. 04/22/15   Mercedes Camprubi-Soms, PA-C  oxyCODONE-acetaminophen (PERCOCET/ROXICET) 5-325 MG tablet Take 1 tablet by mouth every 6 (six) hours as needed for severe pain. 12/17/15   Bethann Berkshire, MD   BP 218/97 mmHg  Pulse 62  Temp(Src) 98.1 F (36.7 C) (Oral)  Resp 18  SpO2 100% Physical Exam  Constitutional: She is oriented to person, place, and time. She appears well-developed.  HENT:  Head: Normocephalic.  Patient has a tender right upper molar. She also has a cyst about 1 cm in diameter on the top of her head which is tender  Eyes: Conjunctivae and EOM are normal. No scleral icterus.  Neck: Neck supple. No thyromegaly present.  Cardiovascular: Normal rate and regular rhythm.  Exam reveals no gallop and no friction rub.   No murmur heard. Pulmonary/Chest: No stridor. She has no wheezes. She has no rales. She exhibits no tenderness.  Abdominal: She exhibits no distension. There is no tenderness. There is no  rebound.  Musculoskeletal: Normal range of motion. She exhibits no edema.  Lymphadenopathy:    She has no cervical adenopathy.  Neurological: She is oriented to person, place, and time. She exhibits normal muscle tone. Coordination normal.  Skin: No rash noted. No erythema.  Psychiatric: She has a normal mood and affect. Her behavior is normal.    ED Course  Procedures (including critical care time) Labs  Review Labs Reviewed - No data to display  Imaging Review No results found. I have personally reviewed and evaluated these images and lab results as part of my medical decision-making.   EKG Interpretation None      MDM   Final diagnoses:  Toothache    Patient given Cleocin and Percocet for her toothache. She is referred to a dentist. Her blood pressure was significantly elevated because she did not take her medicine today. She was told to take her medicine and follow-up with her PCP. Cyst on top of her head she is referred to ENT    Bethann BerkshireJoseph Joh Rao, MD 12/17/15 1109

## 2016-04-15 ENCOUNTER — Ambulatory Visit: Payer: Self-pay | Admitting: Family Medicine

## 2016-05-03 ENCOUNTER — Ambulatory Visit (INDEPENDENT_AMBULATORY_CARE_PROVIDER_SITE_OTHER): Payer: Self-pay | Admitting: Family Medicine

## 2016-05-03 ENCOUNTER — Encounter: Payer: Self-pay | Admitting: Family Medicine

## 2016-05-03 VITALS — BP 190/110 | HR 72 | Temp 99.5°F | Ht 70.0 in | Wt 258.0 lb

## 2016-05-03 DIAGNOSIS — E138 Other specified diabetes mellitus with unspecified complications: Secondary | ICD-10-CM

## 2016-05-03 LAB — COMPLETE METABOLIC PANEL WITH GFR
ALBUMIN: 4.1 g/dL (ref 3.6–5.1)
ALK PHOS: 48 U/L (ref 33–115)
ALT: 15 U/L (ref 6–29)
AST: 15 U/L (ref 10–30)
BILIRUBIN TOTAL: 0.9 mg/dL (ref 0.2–1.2)
BUN: 10 mg/dL (ref 7–25)
CO2: 24 mmol/L (ref 20–31)
Calcium: 9.1 mg/dL (ref 8.6–10.2)
Chloride: 104 mmol/L (ref 98–110)
Creat: 0.56 mg/dL (ref 0.50–1.10)
GFR, Est African American: >89 mL/min (ref >=60)
Glucose, Bld: 127 mg/dL — ABNORMAL HIGH (ref 65–99)
POTASSIUM: 4.4 mmol/L (ref 3.5–5.3)
Sodium: 137 mmol/L (ref 135–146)
Total Protein: 6.9 g/dL (ref 6.1–8.1)

## 2016-05-03 LAB — LIPID PANEL
Cholesterol: 182 mg/dL (ref 125–200)
HDL: 50 mg/dL (ref >=46)
LDL CALC: 118 mg/dL (ref <130)
TRIGLYCERIDES: 71 mg/dL (ref <150)
Total CHOL/HDL Ratio: 3.6 Ratio (ref <=5.0)
VLDL: 14 mg/dL (ref <30)

## 2016-05-03 LAB — CBC WITH DIFFERENTIAL/PLATELET
BASOS ABS: 0 {cells}/uL (ref 0–200)
Basophils Relative: 0 %
EOS PCT: 1 %
Eosinophils Absolute: 70 cells/uL (ref 15–500)
HCT: 39.8 % (ref 35.0–45.0)
HEMOGLOBIN: 13.1 g/dL (ref 11.7–15.5)
Lymphocytes Relative: 40 %
Lymphs Abs: 2800 cells/uL (ref 850–3900)
MCH: 28.1 pg (ref 27.0–33.0)
MCHC: 32.9 g/dL (ref 32.0–36.0)
MCV: 85.4 fL (ref 80.0–100.0)
MONOS PCT: 6 %
MPV: 8.9 fL (ref 7.5–12.5)
Monocytes Absolute: 420 cells/uL (ref 200–950)
NEUTROS ABS: 3710 {cells}/uL (ref 1500–7800)
NEUTROS PCT: 53 %
PLATELETS: 317 10*3/uL (ref 140–400)
RBC: 4.66 MIL/uL (ref 3.80–5.10)
RDW: 14.3 % (ref 11.0–15.0)
WBC: 7 10*3/uL (ref 3.8–10.8)

## 2016-05-03 NOTE — Patient Instructions (Addendum)
Will get with you in a day or two about restarting medications. Will [facility] to [facility] at [address] [place], [place] The pharmacy there will help you with your medications.  Find out for me what type of insulin you were on.  Avoid sweets and other carbohydrates in diet. Try to exercise regularly Ask about orange card when you go to the pharmacy listed above.

## 2016-05-04 LAB — MICROALBUMIN, URINE: Microalb, Ur: 1.4 mg/dL

## 2016-05-04 LAB — HEMOGLOBIN A1C
Hgb A1c MFr Bld: 7 % — ABNORMAL HIGH (ref <5.7)
Mean Plasma Glucose: 154 mg/dL

## 2016-05-07 NOTE — Progress Notes (Signed)
Patient ID: Latoya Strickland, female   DOB: 06/23/1976, 40 y.o.   MRN: [id number]   Latoya Strickland, is a 40 y.o. female  VQQ:[id number]  [id number]  DOB - 11/05/1976  CC:  Chief Complaint  Patient presents with  . new patient/get established    no rx for 1 year, patient is diabetic and has HTN, and severe tooth pain upper right side, pain scale of 8 today,knot on top of head, has been there for 11 year       HPI: Latoya Strickland is a 40 y.o. female here to establish care. She reports a history of type II diabetes but has not been on medication in several years. She reports being on insulin but does not know what kind. She also reports a history of untreated hypertension. She complains of tooth pain on the upper right and a knot on her head that has been there for 11 years.  She has a history of arthritis, DDD, PCOS, carpel tunnel syndriome.  Allergies  Allergen Reactions  . Lyrica [Pregabalin] Other (See Comments)    Hair loss   . Ultracet [Tramadol-Acetaminophen] Other (See Comments)    Stomach burning   . Ultram [Tramadol] Other (See Comments)    Stomach burning    Past Medical History  Diagnosis Date  . Blood transfusion without reported diagnosis   . Diabetes mellitus without complication (HCC)   . Hypertension   . Arthritis   . Lumbar herniated disc   . DJD (degenerative joint disease)   . PCOS (polycystic ovarian syndrome)   . Carpal tunnel syndrome    Current Outpatient Prescriptions on File Prior to Visit  Medication Sig Dispense Refill  . cephALEXin (KEFLEX) 500 MG capsule 2 caps po bid x 7 days (Patient not taking: Reported on 05/03/2016) 28 capsule 0  . clindamycin (CLEOCIN) 150 MG capsule Take 2 tablets every 6 hours (Patient not taking: Reported on 05/03/2016) 56 capsule 0  . hydrochlorothiazide (HYDRODIURIL) 25 MG tablet Take 1 tablet (25 mg total) by mouth daily. (Patient not taking: Reported on 05/03/2016) 30 tablet 0  . HYDROcodone-acetaminophen (NORCO) 5-325  MG per tablet Take 1 tablet by mouth every 6 (six) hours as needed for severe pain. (Patient not taking: Reported on 05/03/2016) 10 tablet 0  . oxyCODONE-acetaminophen (PERCOCET/ROXICET) 5-325 MG tablet Take 1 tablet by mouth every 6 (six) hours as needed for severe pain. (Patient not taking: Reported on 05/03/2016) 6 tablet 0   No current facility-administered medications on file prior to visit.   History reviewed. No pertinent family history. Social History   Social History  . Marital Status: Single    Spouse Name: N/A  . Number of Children: N/A  . Years of Education: N/A   Occupational History  . Not on file.   Social History Main Topics  . Smoking status: Never Smoker   . Smokeless tobacco: Not on file  . Alcohol Use: No  . Drug Use: No  . Sexual Activity: Not on file   Other Topics Concern  . Not on file   Social History Narrative    Review of Systems: Constitutional: Negative for fever, chills, appetite change, weight loss,  Fatigue. Complains of being hot and cold. Skin: Negative for rashes or lesions of concern. HENT: Negative for ear pain, ear discharge.nose bleeds Eyes: Negative for pain, discharge, redness, itching and visual disturbance. Neck: Negative for pain, stiffness Respiratory: Negative for cough, shortness of breath,   Cardiovascular: Negative for chest pain, palpitations and leg swelling.  Gastrointestinal: Negative for abdominal pain, nausea, vomiting, diarrhea, constipations Genitourinary: Negative for dysuria, urgency, frequency, hematuria,  Musculoskeletal: Negative for back pain, joint pain, joint  swelling, and gait problem.Negative for weakness. Neurological: Negative for dizziness, tremors, seizures, syncope,   light-headedness, numbness and headaches.  Hematological: Negative for easy bruising or bleeding Psychiatric/Behavioral: Negative for depression, anxiety, decreased concentration, confusion   Objective:   Filed Vitals:   05/03/16 1414  05/03/16 1416  BP: 189/108 190/110  Pulse: 72   Temp: 99.5 F (37.5 Strickland)     Physical Exam: Constitutional: Patient appears well-developed and well-nourished. No distress. HENT: Normocephalic, atraumatic, External right and left ear normal. Oropharynx is clear and moist.  Eyes: Conjunctivae and EOM are normal. PERRLA, no scleral icterus. Neck: Normal ROM. Neck supple. No lymphadenopathy, No thyromegaly. CVS: RRR, S1/S2 +, no murmurs, no gallops, no rubs Pulmonary: Effort and breath sounds normal, no stridor, rhonchi, wheezes, rales.  Abdominal: Soft. Normoactive BS,, no distension, tenderness, rebound or guarding.  Musculoskeletal: Normal range of motion. No edema and no tenderness.  Neuro: Alert.Normal muscle tone coordination. Non-focal Skin: Skin is warm and dry. No rash noted. Not diaphoretic. No erythema. No pallor. Psychiatric: Normal mood and affect. Behavior, judgment, thought content normal.  Lab Results  Component Value Date   WBC 7.0 05/03/2016   HGB 13.1 05/03/2016   HCT 39.8 05/03/2016   MCV 85.4 05/03/2016   PLT 317 05/03/2016   Lab Results  Component Value Date   CREATININE 0.56 05/03/2016   BUN 10 05/03/2016   NA 137 05/03/2016   K 4.4 05/03/2016   CL 104 05/03/2016   CO2 24 05/03/2016    Lab Results  Component Value Date   HGBA1C 7.0* 05/03/2016   Lipid Panel     Component Value Date/Time   CHOL 182 05/03/2016 1506   TRIG 71 05/03/2016 1506   HDL 50 05/03/2016 1506   CHOLHDL 3.6 05/03/2016 1506   VLDL 14 05/03/2016 1506   LDLCALC 118 05/03/2016 1506       Assessment and plan:   1. Diabetes mellitus of other type with complication (HCC)  - COMPLETE METABOLIC PANEL WITH GFR - CBC with Differential - Hemoglobin A1c - Microalbumin, urine - Lipid panel   Return in about 3 months (around 08/03/2016) for HTN, Diabetes, A1C.  The patient was given clear instructions to go to ER or return to medical center if symptoms don't improve, worsen or  new problems develop. The patient verbalized understanding.    Latoya HooverLinda Strickland Shevette Latoya Strickland  05/07/2016, 8:17 AM

## 2016-05-11 ENCOUNTER — Telehealth: Payer: Self-pay | Admitting: *Deleted

## 2016-05-11 NOTE — Telephone Encounter (Signed)
Patient verified DOB Patient is aware of Diabetes being controlled and A1C being 7.0 Patient is also aware of Cholesterol being good. Patient is concerned with BP being severely high. Patient is requesting medication for BP.

## 2016-05-11 NOTE — Telephone Encounter (Signed)
Medical Assistant left message on patient's home and cell voicemail. Voicemail states to give a call back to Samantha Olivera with [facility] at [phone].  

## 2016-05-11 NOTE — Telephone Encounter (Signed)
-----   Message from Henrietta HooverLinda C Bernhardt, NP sent at 05/07/2016  7:41 AM EDT ----- Diabetes in adequate control. A1C 7.0. Cholesterol good.

## 2016-05-14 ENCOUNTER — Other Ambulatory Visit: Payer: Self-pay | Admitting: Family Medicine

## 2016-05-14 NOTE — Telephone Encounter (Signed)
Is she still taking HCTZ prescribed before?  If so she need to come in for BP check so we can see if it is working.. Can be a nurse visit.

## 2016-05-14 NOTE — Telephone Encounter (Signed)
Called and left voicemail asking patient to call back regarding taking  BP med and to make an appointment to see if HCTZ is helping if she is currently taking it. Thanks!

## 2016-07-10 ENCOUNTER — Encounter (HOSPITAL_COMMUNITY): Payer: Self-pay | Admitting: *Deleted

## 2016-07-10 ENCOUNTER — Emergency Department (HOSPITAL_COMMUNITY): Payer: Medicaid - Out of State

## 2016-07-10 ENCOUNTER — Emergency Department (HOSPITAL_COMMUNITY)
Admission: EM | Admit: 2016-07-10 | Discharge: 2016-07-10 | Disposition: A | Discharge status: Home / Self Care | Payer: Medicaid - Out of State | Attending: Emergency Medicine | Admitting: Emergency Medicine

## 2016-07-10 DIAGNOSIS — Y92410 Unspecified street and highway as the place of occurrence of the external cause: Secondary | ICD-10-CM | POA: Diagnosis not present

## 2016-07-10 DIAGNOSIS — M542 Cervicalgia: Secondary | ICD-10-CM | POA: Diagnosis not present

## 2016-07-10 DIAGNOSIS — S60221A Contusion of right hand, initial encounter: Secondary | ICD-10-CM | POA: Diagnosis not present

## 2016-07-10 DIAGNOSIS — Y999 Unspecified external cause status: Secondary | ICD-10-CM | POA: Diagnosis not present

## 2016-07-10 DIAGNOSIS — S6991XA Unspecified injury of right wrist, hand and finger(s), initial encounter: Secondary | ICD-10-CM | POA: Diagnosis present

## 2016-07-10 DIAGNOSIS — I10 Essential (primary) hypertension: Secondary | ICD-10-CM | POA: Diagnosis not present

## 2016-07-10 DIAGNOSIS — Y939 Activity, unspecified: Secondary | ICD-10-CM | POA: Diagnosis not present

## 2016-07-10 DIAGNOSIS — E119 Type 2 diabetes mellitus without complications: Secondary | ICD-10-CM | POA: Insufficient documentation

## 2016-07-10 MED ORDER — CYCLOBENZAPRINE HCL 10 MG PO TABS: 10.0000 mg | ORAL_TABLET | Freq: Two times a day (BID) | ORAL | Status: DC | PRN

## 2016-07-10 MED ORDER — IBUPROFEN 800 MG PO TABS: 800.0000 mg | ORAL_TABLET | Freq: Three times a day (TID) | ORAL | Status: DC
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
## 2016-07-10 NOTE — Discharge Instructions (Signed)

## 2016-07-13 ENCOUNTER — Emergency Department (HOSPITAL_COMMUNITY): Payer: Medicaid - Out of State

## 2016-07-13 ENCOUNTER — Emergency Department (HOSPITAL_COMMUNITY)
Admission: EM | Admit: 2016-07-13 | Discharge: 2016-07-13 | Disposition: A | Discharge status: Home / Self Care | Payer: Medicaid - Out of State | Attending: Emergency Medicine | Admitting: Emergency Medicine

## 2016-07-13 ENCOUNTER — Encounter (HOSPITAL_COMMUNITY): Payer: Self-pay | Admitting: Vascular Surgery

## 2016-07-13 DIAGNOSIS — S161XXA Strain of muscle, fascia and tendon at neck level, initial encounter: Secondary | ICD-10-CM | POA: Insufficient documentation

## 2016-07-13 DIAGNOSIS — Y939 Activity, unspecified: Secondary | ICD-10-CM | POA: Diagnosis not present

## 2016-07-13 DIAGNOSIS — E119 Type 2 diabetes mellitus without complications: Secondary | ICD-10-CM | POA: Diagnosis not present

## 2016-07-13 DIAGNOSIS — I10 Essential (primary) hypertension: Secondary | ICD-10-CM | POA: Diagnosis not present

## 2016-07-13 DIAGNOSIS — M7918 Myalgia, other site: Secondary | ICD-10-CM

## 2016-07-13 DIAGNOSIS — M791 Myalgia: Secondary | ICD-10-CM | POA: Diagnosis not present

## 2016-07-13 DIAGNOSIS — S60211A Contusion of right wrist, initial encounter: Secondary | ICD-10-CM | POA: Diagnosis not present

## 2016-07-13 DIAGNOSIS — S161XXD Strain of muscle, fascia and tendon at neck level, subsequent encounter: Secondary | ICD-10-CM

## 2016-07-13 DIAGNOSIS — Y92410 Unspecified street and highway as the place of occurrence of the external cause: Secondary | ICD-10-CM | POA: Diagnosis not present

## 2016-07-13 DIAGNOSIS — Y999 Unspecified external cause status: Secondary | ICD-10-CM | POA: Diagnosis not present

## 2016-07-13 DIAGNOSIS — S199XXA Unspecified injury of neck, initial encounter: Secondary | ICD-10-CM | POA: Diagnosis present

## 2016-07-13 NOTE — ED Notes (Signed)
See PA assessment. Pt ambulating in hallway, visiting another pt.
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
## 2016-07-13 NOTE — ED Notes (Signed)
Patient ambulating in and out of room visiting other patient.

## 2016-07-13 NOTE — ED Notes (Signed)
Pt advised to follow up with PCP regarding BP.
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
## 2016-07-13 NOTE — ED Notes (Signed)
Returned from xray

## 2016-08-06 ENCOUNTER — Encounter: Payer: Self-pay | Admitting: Family Medicine

## 2016-08-06 ENCOUNTER — Ambulatory Visit (INDEPENDENT_AMBULATORY_CARE_PROVIDER_SITE_OTHER): Payer: Self-pay

## 2016-08-06 VITALS — BP 188/104 | HR 64 | Temp 98.5°F | Resp 16 | Ht 70.0 in | Wt 258.0 lb

## 2016-08-06 DIAGNOSIS — E138 Other specified diabetes mellitus with unspecified complications: Secondary | ICD-10-CM

## 2016-08-06 DIAGNOSIS — I10 Essential (primary) hypertension: Secondary | ICD-10-CM

## 2016-08-06 DIAGNOSIS — M1990 Unspecified osteoarthritis, unspecified site: Secondary | ICD-10-CM

## 2016-08-06 DIAGNOSIS — E139 Other specified diabetes mellitus without complications: Secondary | ICD-10-CM

## 2016-08-06 LAB — POCT GLYCOSYLATED HEMOGLOBIN (HGB A1C): HEMOGLOBIN A1C: 6.8

## 2016-08-06 MED ORDER — LISINOPRIL-HYDROCHLOROTHIAZIDE 20-25 MG PO TABS: 1.0000 | ORAL_TABLET | Freq: Every day | ORAL | 3 refills | Status: DC

## 2016-08-06 MED ORDER — CLONIDINE HCL 0.1 MG PO TABS
0.1000 mg | ORAL_TABLET | Freq: Once | ORAL | Status: AC
Start: 2016-08-06 — End: 2016-08-06
  Administered 2016-08-06: 0.1 mg via ORAL

## 2016-08-06 MED ORDER — METFORMIN HCL 500 MG PO TABS: 500.0000 mg | ORAL_TABLET | Freq: Two times a day (BID) | ORAL | 1 refills | Status: DC

## 2016-08-06 NOTE — Patient Instructions (Addendum)
If you get a VIC card at [facility], You may be able to get your metformin free. Get back with me in 3 months Work on diet, No fried foods, limited cholesterol, low carb.  Also adding lisinopril/hct 20/25. Need to get BP check by nurse in 2 weeks.

## 2016-08-09 DIAGNOSIS — E119 Type 2 diabetes mellitus without complications: Secondary | ICD-10-CM | POA: Insufficient documentation

## 2016-08-09 DIAGNOSIS — M1990 Unspecified osteoarthritis, unspecified site: Secondary | ICD-10-CM | POA: Insufficient documentation

## 2016-08-09 DIAGNOSIS — I10 Essential (primary) hypertension: Secondary | ICD-10-CM | POA: Insufficient documentation
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
## 2016-08-19 ENCOUNTER — Telehealth: Payer: Self-pay

## 2016-08-19 ENCOUNTER — Ambulatory Visit: Payer: Medicaid - Out of State

## 2016-08-19 NOTE — Telephone Encounter (Signed)
LM for patient to call back, she should have refills.

## 2016-08-20 ENCOUNTER — Ambulatory Visit: Payer: Medicaid - Out of State

## 2016-08-26 ENCOUNTER — Ambulatory Visit: Payer: Medicaid - Out of State

## 2016-09-02 ENCOUNTER — Ambulatory Visit (INDEPENDENT_AMBULATORY_CARE_PROVIDER_SITE_OTHER): Payer: Self-pay

## 2016-09-02 ENCOUNTER — Other Ambulatory Visit: Payer: Self-pay | Admitting: Family Medicine

## 2016-09-02 VITALS — BP 206/115

## 2016-09-02 DIAGNOSIS — I159 Secondary hypertension, unspecified: Secondary | ICD-10-CM

## 2016-09-02 MED ORDER — CLONIDINE HCL 0.1 MG PO TABS
0.2000 mg | ORAL_TABLET | Freq: Once | ORAL | Status: AC
  Administered 2016-09-02: 0.2 mg via ORAL

## 2016-09-02 MED ORDER — AMLODIPINE BESYLATE 10 MG PO TABS: 10.0000 mg | ORAL_TABLET | Freq: Every day | ORAL | 3 refills | Status: DC

## 2016-09-03 NOTE — Patient Instructions (Signed)
BP was elevated, Concepcion LivingLinda Bernhardt, NP ordered clonidine 0.2mg  and patient's bp was rechecked after 30 minutes. BP had decreased slightly and patients medication was changed by Concepcion LivingLinda Bernhardt, NP patient was advised to start new medication and return to clinic in 1 week for BP check.

## 2016-09-03 NOTE — Progress Notes (Signed)
Patients bp was checked today

## 2016-09-10 ENCOUNTER — Ambulatory Visit: Payer: Medicaid - Out of State

## 2016-09-10 ENCOUNTER — Other Ambulatory Visit: Payer: Self-pay | Admitting: Family Medicine

## 2016-09-10 VITALS — BP 202/86

## 2016-09-10 DIAGNOSIS — I10 Essential (primary) hypertension: Secondary | ICD-10-CM

## 2016-09-10 MED ORDER — METOPROLOL TARTRATE 25 MG PO TABS: 25.0000 mg | ORAL_TABLET | Freq: Two times a day (BID) | ORAL | 0 refills | Status: DC

## 2016-09-24 ENCOUNTER — Ambulatory Visit: Payer: Medicaid - Out of State

## 2016-09-24 VITALS — BP 188/89 | HR 59

## 2016-09-24 DIAGNOSIS — I10 Essential (primary) hypertension: Secondary | ICD-10-CM

## 2016-09-29 ENCOUNTER — Ambulatory Visit (INDEPENDENT_AMBULATORY_CARE_PROVIDER_SITE_OTHER): Payer: Self-pay | Admitting: Family Medicine

## 2016-09-29 ENCOUNTER — Encounter: Payer: Self-pay | Admitting: Family Medicine

## 2016-09-29 VITALS — BP 185/103 | HR 75 | Temp 98.5°F | Resp 16 | Ht 70.0 in | Wt 263.0 lb

## 2016-09-29 DIAGNOSIS — I10 Essential (primary) hypertension: Secondary | ICD-10-CM

## 2016-09-29 DIAGNOSIS — M25569 Pain in unspecified knee: Secondary | ICD-10-CM

## 2016-09-29 LAB — TSH: TSH: 2.05 mIU/L

## 2016-09-29 MED ORDER — METOPROLOL SUCCINATE ER 200 MG PO TB24: 200.0000 mg | ORAL_TABLET | Freq: Every day | ORAL | 1 refills | Status: DC

## 2016-09-29 MED ORDER — METOPROLOL SUCCINATE ER 100 MG PO TB24
100.0000 mg | ORAL_TABLET | Freq: Every day | ORAL | 1 refills | Status: DC
Start: 2016-09-29 — End: 2016-09-29

## 2016-09-29 NOTE — Patient Instructions (Signed)
Return stool cards Take new medication once a day. Stop metoprolol (Lopressor).  Take Excedrin for leg pain.

## 2016-09-29 NOTE — Progress Notes (Signed)
Latoya Strickland, is a 40 y.o. female  WUJ:[id number]  [id number]  DOB - 1976-12-02  CC:  Chief Complaint  Patient presents with  . Rectal Bleeding  . Hypertension  . Leg Pain    NUMBNESS IN LEFT LEG        HPI: Latoya Strickland is a 40 y.o. female presents for sick visit, complaining of numbness and aching of her right leg from hip down.  This is not a new problem. 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 She has been told in the past this is probably diabetic neuropathy (unlikely). She has been treated with narcotics in past while in [place] It was at one point recommended she have a narcotic pump. She also has hypertension. She is on amlodipine 10, lisinopril/hctz 20/25, and metoprolol 25. Her BP today is 191/99 on all medications. She denies chest pain, shortness of breath, palpitations, swelling of feet and ankles.   Allergies  Allergen Reactions  . Lyrica [Pregabalin] Other (See Comments)    Hair loss   . Ultracet [Tramadol-Acetaminophen] Other (See Comments)    Stomach burning   . Ultram [Tramadol] Other (See Comments)    Stomach burning    Past Medical History:  Diagnosis Date  . Arthritis   . Blood transfusion without reported diagnosis   . Carpal tunnel syndrome   . Diabetes mellitus without complication (HCC)   . DJD (degenerative joint disease)   . Hypertension   . Lumbar herniated disc   . PCOS (polycystic ovarian syndrome)    Current Outpatient Prescriptions on File Prior to Visit  Medication Sig Dispense Refill  . amLODipine (NORVASC) 10 MG tablet Take 1 tablet (10 mg total) by mouth daily. 90 tablet 3  . cyclobenzaprine (FLEXERIL) 10 MG tablet Take 1 tablet (10 mg total) by mouth 2 (two) times daily as needed for muscle spasms. 20 tablet 0  . ibuprofen (ADVIL,MOTRIN) 800 MG tablet Take 1 tablet (800 mg total) by mouth 3 (three) times daily. 21 tablet 0  . lisinopril-hydrochlorothiazide (PRINZIDE,ZESTORETIC) 20-25 MG tablet Take 1 tablet by mouth daily. 90  tablet 3  . metFORMIN (GLUCOPHAGE) 500 MG tablet Take 1 tablet (500 mg total) by mouth 2 (two) times daily with a meal. 180 tablet 1  . cephALEXin (KEFLEX) 500 MG capsule 2 caps po bid x 7 days (Patient not taking: Reported on 09/29/2016) 28 capsule 0  . clindamycin (CLEOCIN) 150 MG capsule Take 2 tablets every 6 hours (Patient not taking: Reported on 09/29/2016) 56 capsule 0   No current facility-administered medications on file prior to visit.    History reviewed. No pertinent family history. Social History   Social History  . Marital status: Single    Spouse name: N/A  . Number of children: N/A  . Years of education: N/A   Occupational History  . Not on file.   Social History Main Topics  . Smoking status: Never Smoker  . Smokeless tobacco: Never Used  . Alcohol use No  . Drug use: No  . Sexual activity: Not on file   Other Topics Concern  . Not on file   Social History Narrative  . No narrative on file    Review of Systems: See HPI.  Objective:   Vitals:   09/29/16 1143 09/29/16 1148  BP: (!) 191/99 (!) 185/103  Pulse: 75   Resp: 16   Temp: 98.5 F (36.9 C)     Physical Exam: Constitutional: Patient appears well-developed and well-nourished.  No distress. HENT: Normocephalic, atraumatic, External right and left ear normal. Oropharynx is clear and moist.  Eyes: Conjunctivae and EOM are normal. PERRLA, no scleral icterus. Neck: Normal ROM. Neck supple. No lymphadenopathy, No thyromegaly. CVS: RRR, S1/S2 +, no murmurs, no gallops, no rubs Pulmonary: Effort and breath sounds normal, no stridor, rhonchi, wheezes, rales.  Abdominal: Soft. Normoactive BS,, no distension, tenderness, rebound or guarding.  Musculoskeletal: Normal range of motion. No edema and no tenderness.  Neuro: Alert.Normal muscle tone coordination. Non-focal Skin: Skin is warm and dry. No rash noted. Not diaphoretic. No erythema. No pallor. Psychiatric: Normal mood and affect. Behavior,  judgment, thought content normal.  Lab Results  Component Value Date   WBC 7.0 05/03/2016   HGB 13.1 05/03/2016   HCT 39.8 05/03/2016   MCV 85.4 05/03/2016   PLT 317 05/03/2016   Lab Results  Component Value Date   CREATININE 0.56 05/03/2016   BUN 10 05/03/2016   NA 137 05/03/2016   K 4.4 05/03/2016   CL 104 05/03/2016   CO2 24 05/03/2016    Lab Results  Component Value Date   HGBA1C 6.8 08/06/2016   Lipid Panel     Component Value Date/Time   CHOL 182 05/03/2016 1506   TRIG 71 05/03/2016 1506   HDL 50 05/03/2016 1506   CHOLHDL 3.6 05/03/2016 1506   VLDL 14 05/03/2016 1506   LDLCALC 118 05/03/2016 1506       Assessment and plan:   1. Essential hypertension - Change Lopressor 50 mg. Bid to Toprol XL 200 mg  - TSH  2. Arthralgia of lower leg, unspecified laterality -Recommend Excedrin    Return in about 3 weeks (around 10/20/2016) for HTN nurse visit. .  The patient was given clear instructions to go to ER or return to medical center if symptoms don't improve, worsen or new problems develop. The patient verbalized understanding.    Henrietta HooverLinda C Labrenda Lasky FNP  09/29/2016, 2:44 PM

## 2016-09-30 ENCOUNTER — Other Ambulatory Visit (INDEPENDENT_AMBULATORY_CARE_PROVIDER_SITE_OTHER): Payer: Self-pay

## 2016-09-30 DIAGNOSIS — Z1211 Encounter for screening for malignant neoplasm of colon: Secondary | ICD-10-CM

## 2016-09-30 LAB — POC HEMOCCULT BLD/STL (HOME/3-CARD/SCREEN)
Card #2 Fecal Occult Blod, POC: NEGATIVE
Card #3 Fecal Occult Blood, POC: NEGATIVE
FECAL OCCULT BLD: NEGATIVE

## 2016-10-07 ENCOUNTER — Emergency Department (HOSPITAL_COMMUNITY)
Admission: EM | Admit: 2016-10-07 | Discharge: 2016-10-07 | Disposition: A | Discharge status: Home / Self Care | Payer: Medicaid - Out of State | Attending: Emergency Medicine | Admitting: Emergency Medicine

## 2016-10-07 ENCOUNTER — Encounter (HOSPITAL_COMMUNITY): Payer: Self-pay | Admitting: Emergency Medicine

## 2016-10-07 ENCOUNTER — Emergency Department (HOSPITAL_COMMUNITY): Payer: Medicaid - Out of State

## 2016-10-07 DIAGNOSIS — E119 Type 2 diabetes mellitus without complications: Secondary | ICD-10-CM | POA: Insufficient documentation

## 2016-10-07 DIAGNOSIS — O2311 Infections of bladder in pregnancy, first trimester: Secondary | ICD-10-CM | POA: Insufficient documentation

## 2016-10-07 DIAGNOSIS — O26891 Other specified pregnancy related conditions, first trimester: Secondary | ICD-10-CM | POA: Diagnosis present

## 2016-10-07 DIAGNOSIS — Z3A01 Less than 8 weeks gestation of pregnancy: Secondary | ICD-10-CM | POA: Insufficient documentation

## 2016-10-07 DIAGNOSIS — O0281 Inappropriate change in quantitative human chorionic gonadotropin (hCG) in early pregnancy: Secondary | ICD-10-CM | POA: Diagnosis not present

## 2016-10-07 DIAGNOSIS — I10 Essential (primary) hypertension: Secondary | ICD-10-CM | POA: Insufficient documentation

## 2016-10-07 DIAGNOSIS — Z7984 Long term (current) use of oral hypoglycemic drugs: Secondary | ICD-10-CM | POA: Insufficient documentation

## 2016-10-07 DIAGNOSIS — N3000 Acute cystitis without hematuria: Secondary | ICD-10-CM

## 2016-10-07 LAB — CBC
HCT: 37.5 % (ref 36.0–46.0)
HEMOGLOBIN: 12.9 g/dL (ref 12.0–15.0)
MCH: 28.5 pg (ref 26.0–34.0)
MCHC: 34.4 g/dL (ref 30.0–36.0)
MCV: 83 fL (ref 78.0–100.0)
Platelets: 295 10*3/uL (ref 150–400)
RBC: 4.52 MIL/uL (ref 3.87–5.11)
RDW: 13 % (ref 11.5–15.5)
WBC: 6.9 10*3/uL (ref 4.0–10.5)

## 2016-10-07 LAB — COMPREHENSIVE METABOLIC PANEL
ALT: 13 U/L — ABNORMAL LOW (ref 14–54)
ANION GAP: 7 (ref 5–15)
AST: 13 U/L — ABNORMAL LOW (ref 15–41)
Albumin: 3.9 g/dL (ref 3.5–5.0)
Alkaline Phosphatase: 42 U/L (ref 38–126)
BUN: 9 mg/dL (ref 6–20)
CHLORIDE: 103 mmol/L (ref 101–111)
CO2: 23 mmol/L (ref 22–32)
Calcium: 9.1 mg/dL (ref 8.9–10.3)
Creatinine, Ser: 0.62 mg/dL (ref 0.44–1.00)
GFR calc non Af Amer: >60 mL/min (ref >60)
Glucose, Bld: 167 mg/dL — ABNORMAL HIGH (ref 65–99)
Potassium: 4 mmol/L (ref 3.5–5.1)
SODIUM: 133 mmol/L — AB (ref 135–145)
Total Bilirubin: 1.5 mg/dL — ABNORMAL HIGH (ref 0.3–1.2)
Total Protein: 7 g/dL (ref 6.5–8.1)

## 2016-10-07 LAB — URINALYSIS, ROUTINE W REFLEX MICROSCOPIC
Bilirubin Urine: NEGATIVE
GLUCOSE, UA: NEGATIVE mg/dL
HGB URINE DIPSTICK: NEGATIVE
Ketones, ur: NEGATIVE mg/dL
Leukocytes, UA: NEGATIVE
Nitrite: NEGATIVE
Protein, ur: 30 mg/dL — AB
SPECIFIC GRAVITY, URINE: 1.025 (ref 1.005–1.030)
pH: 6 (ref 5.0–8.0)

## 2016-10-07 LAB — URINE MICROSCOPIC-ADD ON: RBC / HPF: NONE SEEN RBC/hpf (ref 0–5)

## 2016-10-07 LAB — POC OCCULT BLOOD, ED: Fecal Occult Bld: NEGATIVE

## 2016-10-07 LAB — PREGNANCY, URINE: Preg Test, Ur: POSITIVE — AB

## 2016-10-07 LAB — HCG, QUANTITATIVE, PREGNANCY: HCG, BETA CHAIN, QUANT, S: 68649 m[IU]/mL — AB (ref <5)

## 2016-10-07 LAB — LIPASE, BLOOD: LIPASE: 26 U/L (ref 11–51)

## 2016-10-07 MED ORDER — SODIUM CHLORIDE 0.9 % IV BOLUS (SEPSIS)
1000.0000 mL | Freq: Once | INTRAVENOUS | Status: AC
  Administered 2016-10-07: 1000 mL via INTRAVENOUS

## 2016-10-07 MED ORDER — FAMOTIDINE IN NACL 20-0.9 MG/50ML-% IV SOLN
20.0000 mg | INTRAVENOUS | Status: AC
  Administered 2016-10-07: 20 mg via INTRAVENOUS
  Filled 2016-10-07: qty 50

## 2016-10-07 MED ORDER — SUCRALFATE 1 G PO TABS
1.0000 g | ORAL_TABLET | Freq: Once | ORAL | Status: AC
  Administered 2016-10-07: 1 g via ORAL
  Filled 2016-10-07: qty 1

## 2016-10-07 MED ORDER — ONDANSETRON HCL 4 MG/2ML IJ SOLN
4.0000 mg | Freq: Once | INTRAMUSCULAR | Status: AC
  Administered 2016-10-07: 4 mg via INTRAVENOUS
  Filled 2016-10-07: qty 2

## 2016-10-07 MED ORDER — CEPHALEXIN 500 MG PO CAPS: 500.0000 mg | ORAL_CAPSULE | Freq: Two times a day (BID) | ORAL | 0 refills | Status: DC

## 2016-10-07 MED ORDER — PRENATAL COMPLETE 14-0.4 MG PO TABS: 1.0000 | ORAL_TABLET | Freq: Every day | ORAL | 0 refills | Status: DC

## 2016-10-07 NOTE — ED Notes (Signed)
Called lab to add on hcq preg test.

## 2016-10-07 NOTE — ED Provider Notes (Signed)
[facility]-EMERGENCY DEPT Provider Note   CSN: [id number] Arrival date & time: 10/07/16  1236     History   Chief Complaint Chief Complaint  Patient presents with  . Abdominal Pain    HPI Latoya Strickland is a 40 y.o. female.  The history is provided by the patient and medical records.     40 year old female with history of arthritis, diabetes, DJD, hypertension, PCOS, presenting to the ED for abdominal pain. Patient reports left upper abdominal pain for the past 2 weeks. States she did see her primary care doctor about 1 week ago, however did not discuss this fully with her doctor at that time. States pain in her left upper abdomen is gnawing in nature. States that she turns on her left side she feels that "acid" is sloshing around. States that she moves around too much it does cause her to vomit, this is been nonbloody and nonbilious.  States certain smells also make her nauseated lately. States she does have some burning in her throat as well. States last week she did have some blood in her stool, this was tested by her primary care doctor and was negative. States her last bowel movement was 2 days ago, it was reportedly normal. She does report history of hemorrhoids. She has a history of gastric ulcers. She denies any excessive use of aspirin or NSAIDs.  She also reports a "pressure" when urinating.  No dysuria or hematuria.  Past Medical History:  Diagnosis Date  . Arthritis   . Blood transfusion without reported diagnosis   . Carpal tunnel syndrome   . Diabetes mellitus without complication (HCC)   . DJD (degenerative joint disease)   . Hypertension   . Lumbar herniated disc   . PCOS (polycystic ovarian syndrome)     Patient Active Problem List   Diagnosis Date Noted  . Diabetes (HCC) 08/09/2016  . Essential hypertension 08/09/2016  . DJD (degenerative joint disease) 08/09/2016    History reviewed. No pertinent surgical history.  OB History    No data available        Home Medications    Prior to Admission medications   Medication Sig Start Date End Date Taking? Authorizing Provider  amLODipine (NORVASC) 10 MG tablet Take 1 tablet (10 mg total) by mouth daily. 09/02/16   Henrietta HooverLinda C Bernhardt, NP  ibuprofen (ADVIL,MOTRIN) 800 MG tablet Take 1 tablet (800 mg total) by mouth 3 (three) times daily. 07/10/16   Fayrene HelperBowie Tran, PA-C  lisinopril-hydrochlorothiazide (PRINZIDE,ZESTORETIC) 20-25 MG tablet Take 1 tablet by mouth daily. 08/06/16   Henrietta HooverLinda C Bernhardt, NP  metFORMIN (GLUCOPHAGE) 500 MG tablet Take 1 tablet (500 mg total) by mouth 2 (two) times daily with a meal. 08/06/16   Henrietta HooverLinda C Bernhardt, NP  metoprolol (TOPROL XL) 200 MG 24 hr tablet Take 1 tablet (200 mg total) by mouth daily. 09/29/16   Henrietta HooverLinda C Bernhardt, NP    Family History No family history on file.  Social History Social History  Substance Use Topics  . Smoking status: Never Smoker  . Smokeless tobacco: Never Used  . Alcohol use No     Allergies   Lyrica [pregabalin]; Ultracet [tramadol-acetaminophen]; and Ultram [tramadol]   Review of Systems Review of Systems  Gastrointestinal: Positive for abdominal pain, nausea and vomiting.  All other systems reviewed and are negative.    Physical Exam Updated Vital Signs BP 180/89   Pulse (!) 53   Temp 98.2 F (36.8 C) (Oral)   Resp 17  Ht 5\' 10"  (1.778 m)   Wt 120.2 kg   SpO2 100%   BMI 38.02 kg/m   Physical Exam  Constitutional: She is oriented to person, place, and time. She appears well-developed and well-nourished.  HENT:  Head: Normocephalic and atraumatic.  Mouth/Throat: Oropharynx is clear and moist.  Eyes: Conjunctivae and EOM are normal. Pupils are equal, round, and reactive to light.  Neck: Normal range of motion.  Cardiovascular: Normal rate, regular rhythm and normal heart sounds.   Pulmonary/Chest: Effort normal and breath sounds normal.  Abdominal: Soft. Bowel sounds are normal. There is no tenderness. There is  no rigidity and no guarding.  Obese abdomen, no significant distention noted, normal bowel sounds  Genitourinary:  Genitourinary Comments: Small, nonbleeding, nonthrombosed external hemorrhoids noted, no gross blood, Hemoccult negative  Musculoskeletal: Normal range of motion.  Neurological: She is alert and oriented to person, place, and time.  Skin: Skin is warm and dry.  Psychiatric: She has a normal mood and affect.  Nursing note and vitals reviewed.    ED Treatments / Results  Labs (all labs ordered are listed, but only abnormal results are displayed) Labs Reviewed  COMPREHENSIVE METABOLIC PANEL - Abnormal; Notable for the following:       Result Value   Sodium 133 (*)    Glucose, Bld 167 (*)    AST 13 (*)    ALT 13 (*)    Total Bilirubin 1.5 (*)    All other components within normal limits  URINALYSIS, ROUTINE W REFLEX MICROSCOPIC (NOT AT [facility]) - Abnormal; Notable for the following:    Color, Urine AMBER (*)    APPearance HAZY (*)    Protein, ur 30 (*)    All other components within normal limits  URINE MICROSCOPIC-ADD ON - Abnormal; Notable for the following:    Squamous Epithelial / LPF 6-30 (*)    Bacteria, UA MANY (*)    All other components within normal limits  PREGNANCY, URINE - Abnormal; Notable for the following:    Preg Test, Ur POSITIVE (*)    All other components within normal limits  HCG, QUANTITATIVE, PREGNANCY - Abnormal; Notable for the following:    hCG, Beta Chain, Quant, [place] 16,109 (*)    All other components within normal limits  URINE CULTURE  LIPASE, BLOOD  CBC  POC OCCULT BLOOD, ED    EKG  EKG Interpretation None       Radiology US Ob Comp Less 14 Wks  Result Date: 10/07/2016 CLINICAL DATA:  New pregnancy. Diabetes and hypertension. By LMP patient is 7 weeks 6 days. By LMP EDC is 05/20/2017. EXAM: OBSTETRIC <14 WK [place] AND TRANSVAGINAL OB US TECHNIQUE: Both transabdominal and transvaginal ultrasound examinations were performed for  complete evaluation of the gestation as well as the maternal uterus, adnexal regions, and pelvic cul-de-sac. Transvaginal technique was performed to assess early pregnancy. COMPARISON:  None. FINDINGS: Intrauterine gestational sac: Present Yolk sac:  Present Embryo:  Present Cardiac Activity: Present Heart Rate: 120  bpm CRL:  3.8  mm   6 w   0 d                  [place] EDC: 06/02/2017 Subchorionic hemorrhage:  None visualized. Maternal uterus/adnexae: Right ovary is 4.2 x 1.9 x 2.1 cm. Left ovary is 3.6 x 2.7 x 2.8 cm. Small peripheral follicles are present. History of PCOS. Trace free pelvic fluid. IMPRESSION: 1. Single living intrauterine embryo measuring 6 weeks 0 days, significantly different  from clinical dating. 2. By today's exam EDC is 06/02/2017. Electronically Signed   By: Norva Pavlov M.D.   On: 10/07/2016 17:34   US Ob Transvaginal  Result Date: 10/07/2016 CLINICAL DATA:  New pregnancy. Diabetes and hypertension. By LMP patient is 7 weeks 6 days. By LMP EDC is 05/20/2017. EXAM: OBSTETRIC <14 WK [place] AND TRANSVAGINAL OB US TECHNIQUE: Both transabdominal and transvaginal ultrasound examinations were performed for complete evaluation of the gestation as well as the maternal uterus, adnexal regions, and pelvic cul-de-sac. Transvaginal technique was performed to assess early pregnancy. COMPARISON:  None. FINDINGS: Intrauterine gestational sac: Present Yolk sac:  Present Embryo:  Present Cardiac Activity: Present Heart Rate: 120  bpm CRL:  3.8  mm   6 w   0 d                  [place] EDC: 06/02/2017 Subchorionic hemorrhage:  None visualized. Maternal uterus/adnexae: Right ovary is 4.2 x 1.9 x 2.1 cm. Left ovary is 3.6 x 2.7 x 2.8 cm. Small peripheral follicles are present. History of PCOS. Trace free pelvic fluid. IMPRESSION: 1. Single living intrauterine embryo measuring 6 weeks 0 days, significantly different from clinical dating. 2. By today's exam EDC is 06/02/2017. Electronically Signed   By: Norva Pavlov M.D.   On: 10/07/2016 17:34    Procedures Procedures (including critical care time)  Medications Ordered in ED Medications - No data to display   Initial Impression / Assessment and Plan / ED Course  I have reviewed the triage vital signs and the nursing notes.  Pertinent labs & imaging results that were available during my care of the patient were reviewed by me and considered in my medical decision making (see chart for details).  Clinical Course   40 year old female here with abdominal pain. She reports some left upper quadrant abdominal pain with burning sensation. She also reports some lower abdominal pressure with urination.  Labwork ears reassuring. Hemoccult negative.  Urine pregnancy test is positive. UA consistent with infection, culture pending.  Ultrasound obtained, single live IUP approximately [redacted] weeks gestation. Patient symptoms have improved tremendously here with IV fluids, Zofran, and Pepcid. She is drinking oral fluids without difficulty.  Suspect her upper abdominal pain is likely due to acid reflux. Pressure in lower abdomen may be from the UTI combined with new pregnancy. Will be started on Keflex pending urine culture. Also start prenatal vitamins.  Encouraged to follow-up closely with the women's clinic for ongoing prenatal care.  Discussed plan with patient and boyfriend, they both acknowledged understanding and agreed with plan of care.  Return precautions given for new or worsening symptoms.  Final Clinical Impressions(s) / ED Diagnoses   Final diagnoses:  Less than [redacted] weeks gestation of pregnancy  Acute cystitis without hematuria    New Prescriptions Discharge Medication List as of 10/07/2016  6:13 PM    START taking these medications   Details  cephALEXin (KEFLEX) 500 MG capsule Take 1 capsule (500 mg total) by mouth 2 (two) times daily., Starting Thu 10/07/2016, Print    Prenatal Vit-Fe Fumarate-FA (PRENATAL COMPLETE) 14-0.4 MG TABS Take 1 tablet by  mouth daily., Starting Thu 10/07/2016, Print         Garlon Hatchet, PA-C 10/07/16 1858    Doug Sou, MD 10/10/16 [phone]

## 2016-10-07 NOTE — ED Triage Notes (Signed)
Pt states 1 week ago she had bright red stool in her stool, since she has had LLQ pain with n/v. Pt states for blood in stool in the last 3 days. Pt did take a stool sample to PCP.

## 2016-10-07 NOTE — Discharge Instructions (Signed)
Take the prescribed medication as directed. Follow-up with women's clinic for ongoing prenatal care. Should you have any new or worsening symptoms including abdominal pain, heavy vaginal bleeding, vaginal discharge, loss of fluid, etc. I recommend that you follow-up with the [facility] so you may be evaluated by an OB/GYN.

## 2016-10-07 NOTE — ED Notes (Signed)
Patient transported to Ultrasound 

## 2016-10-08 LAB — URINE CULTURE

## 2016-10-12 ENCOUNTER — Telehealth: Payer: Self-pay | Admitting: *Deleted

## 2016-10-12 NOTE — Telephone Encounter (Signed)
Pt left message yesterday @ 1131 stating that she has appt in our office on 11/27 to begin prenatal care. She cannot keep any food down and wants to know what she can do. Please call back.

## 2016-10-19 ENCOUNTER — Telehealth: Payer: Self-pay | Admitting: *Deleted

## 2016-10-19 ENCOUNTER — Ambulatory Visit (INDEPENDENT_AMBULATORY_CARE_PROVIDER_SITE_OTHER): Payer: Self-pay

## 2016-10-19 VITALS — BP 174/87

## 2016-10-19 DIAGNOSIS — I10 Essential (primary) hypertension: Secondary | ICD-10-CM

## 2016-10-19 MED ORDER — HYDRALAZINE HCL 25 MG PO TABS: 25.0000 mg | ORAL_TABLET | Freq: Three times a day (TID) | ORAL | 1 refills | Status: DC

## 2016-10-19 MED ORDER — METHYLDOPA 250 MG PO TABS
250.0000 mg | ORAL_TABLET | Freq: Three times a day (TID) | ORAL | 1 refills | Status: DC
Start: 2016-10-19 — End: 2016-11-15

## 2016-10-19 NOTE — Patient Instructions (Signed)
Patient is aware of receiving a FU call regarding changes to BP medications

## 2016-10-19 NOTE — Progress Notes (Signed)
Patient is here for BP recheck  Patient is [redacted] weeks pregnant at this time.  Patient states the prenatal vitamin causes nausea and she is unable to keep the vitamin down.

## 2016-10-19 NOTE — Telephone Encounter (Signed)
MA left a VM for patient to contact the office regarding appointment and medication changes for today.  !!!Patient is to STOP taking the Amlodipine, Lisinopril, and Metoprolol XL. Patient is to BEGIN the Methyldopa and Hydralazine. Patient should begin immediately and schedule an appointment for 2 weeks after beginning new medications!!!

## 2016-10-20 ENCOUNTER — Ambulatory Visit: Payer: Medicaid - Out of State

## 2016-10-22 NOTE — Telephone Encounter (Signed)
Attempted to call patient. The call was answered but no one spoke, then they hung up. Attempted second call and it went to voice mail immediately. Message left statng that if she is still having the problems with nausea/vomiting she could return my call at the clinic.

## 2016-11-08 ENCOUNTER — Encounter: Payer: Self-pay | Admitting: Family Medicine

## 2016-11-08 ENCOUNTER — Ambulatory Visit (INDEPENDENT_AMBULATORY_CARE_PROVIDER_SITE_OTHER): Payer: Self-pay | Admitting: Family Medicine

## 2016-11-08 VITALS — BP 204/100 | HR 70 | Temp 98.1°F | Resp 12 | Ht 70.0 in | Wt 261.0 lb

## 2016-11-08 DIAGNOSIS — E118 Type 2 diabetes mellitus with unspecified complications: Secondary | ICD-10-CM

## 2016-11-08 MED ORDER — PROMETHAZINE HCL 25 MG RE SUPP: 25.0000 mg | Freq: Four times a day (QID) | RECTAL | 0 refills | Status: DC | PRN

## 2016-11-08 NOTE — Progress Notes (Signed)
Advised patient to take her blood pressure as directed by NP as soon as she gets home since she declines rx here in office today.

## 2016-11-10 NOTE — Progress Notes (Signed)
Latoya Strickland, is a 40 y.o. female  JXB:[id number]:[id number]  ZHY:[id number]  DOB - 08/14/1976  CC:  Chief Complaint  Patient presents with  . Abdominal Pain    has pain in navel area, feels like it is due to pregnancy  . nausea    no vomiting       HPI: Latoya Strickland is a 40 y.o. female here for routine follow-up diabetes. Since her last visit she has found she is pregnant. She has her first appt with her OB in one week. She reports significant nausea and vomiting. When she disccovered she was pregnant. We changed her blood pressure medications but she has not filled and started those yet. Her blood pressure today is 204/100. She declines clonidine at this time. Her las A1C in august was 6.8 on metformin.    Allergies  Allergen Reactions  . Lyrica [Pregabalin] Other (See Comments)    Hair loss   . Ultracet [Tramadol-Acetaminophen] Other (See Comments)    Stomach burning   . Ultram [Tramadol] Other (See Comments)    Stomach burning    Past Medical History:  Diagnosis Date  . Arthritis   . Blood transfusion without reported diagnosis   . Carpal tunnel syndrome   . Diabetes mellitus without complication (HCC)   . DJD (degenerative joint disease)   . Hypertension   . Lumbar herniated disc   . PCOS (polycystic ovarian syndrome)    Current Outpatient Prescriptions on File Prior to Visit  Medication Sig Dispense Refill  . hydrALAZINE (APRESOLINE) 25 MG tablet Take 1 tablet (25 mg total) by mouth 3 (three) times daily. (Patient not taking: Reported on 11/08/2016) 90 tablet 1  . ibuprofen (ADVIL,MOTRIN) 800 MG tablet Take 1 tablet (800 mg total) by mouth 3 (three) times daily. (Patient not taking: Reported on 11/08/2016) 21 tablet 0  . metFORMIN (GLUCOPHAGE) 500 MG tablet Take 1 tablet (500 mg total) by mouth 2 (two) times daily with a meal. (Patient not taking: Reported on 11/08/2016) 180 tablet 1  . methyldopa (ALDOMET) 250 MG tablet Take 1 tablet (250 mg total) by mouth 3 (three)  times daily. (Patient not taking: Reported on 11/08/2016) 90 tablet 1  . Prenatal Vit-Fe Fumarate-FA (PRENATAL COMPLETE) 14-0.4 MG TABS Take 1 tablet by mouth daily. (Patient not taking: Reported on 11/08/2016) 60 each 0   No current facility-administered medications on file prior to visit.    History reviewed. No pertinent family history. Social History   Social History  . Marital status: Single    Spouse name: N/A  . Number of children: N/A  . Years of education: N/A   Occupational History  . Not on file.   Social History Main Topics  . Smoking status: Never Smoker  . Smokeless tobacco: Never Used  . Alcohol use No  . Drug use: No  . Sexual activity: Not on file   Other Topics Concern  . Not on file   Social History Narrative  . No narrative on file    Review of Systems: Constitutional: + fatigue Skin: Negative HENT: Negative  Eyes:+ occ blurry vision  Neck: Negative Respiratory: Shorthness of breath with stairs Cardiovascular: Negative Gastrointestinal: some discomfort around belly button, nausea and vomiting Genitourinary frequency Musculoskeletal:bilateral leg pain,chronic  Neurological: Negative  Hematological: Negative  Psychiatric/Behavioral: Negative    Objective:   Vitals:   11/08/16 1344 11/08/16 1352  BP: (!) 217/109 (!) 204/100  Pulse: 70   Resp: 12   Temp: 98.1 F (36.7 C)  Physical Exam: Constitutional: Patient appears well-developed and well-nourished. No distress. HENT: Normocephalic, atraumatic, External right and left ear normal. Oropharynx is clear and moist.  Eyes: Conjunctivae and EOM are normal. PERRLA, no scleral icterus. Neck: Normal ROM. Neck supple. No lymphadenopathy, No thyromegaly. CVS: RRR, S1/S2 +, no murmurs, no gallops, no rubs Pulmonary: Effort and breath sounds normal, no stridor, rhonchi, wheezes, rales.  Abdominal: Soft. Normoactive BS,, no distension,  rebound or guarding. Very mild per-umbilical  tenderness Musculoskeletal: Normal range of motion. No edema and no tenderness.  Neuro: Alert.Normal muscle tone coordination. Non-focal Skin: Skin is warm and dry. No rash noted. Not diaphoretic. No erythema. No pallor. Psychiatric: Normal mood and affect. Behavior, judgment, thought content normal.  Lab Results  Component Value Date   WBC 6.9 10/07/2016   HGB 12.9 10/07/2016   HCT 37.5 10/07/2016   MCV 83.0 10/07/2016   PLT 295 10/07/2016   Lab Results  Component Value Date   CREATININE 0.62 10/07/2016   BUN 9 10/07/2016   NA 133 (L) 10/07/2016   K 4.0 10/07/2016   CL 103 10/07/2016   CO2 23 10/07/2016    Lab Results  Component Value Date   HGBA1C 6.8 08/06/2016   Lipid Panel     Component Value Date/Time   CHOL 182 05/03/2016 1506   TRIG 71 05/03/2016 1506   HDL 50 05/03/2016 1506   CHOLHDL 3.6 05/03/2016 1506   VLDL 14 05/03/2016 1506   LDLCALC 118 05/03/2016 1506        Assessment and plan:   1. Type 2 diabetes mellitus with complication, without long-term current use of insulin (HCC)  - Ambulatory referral to Ophthalmology  2. Nausea/Vomiting, first trimester -Phenergan 25 mg supp # 20, one per rectum every 6 hours prn nausea vomiting. (discussed with OB)  Return in about 3 months (around 02/08/2017).  The patient was given clear instructions to go to ER or return to medical center if symptoms don't improve, worsen or new problems develop. The patient verbalized understanding.    Henrietta HooverLinda C Daryon Remmert FNP  11/10/2016, 10:20 AM

## 2016-11-15 ENCOUNTER — Encounter: Payer: Self-pay | Attending: Obstetrics & Gynecology | Admitting: Dietician

## 2016-11-15 ENCOUNTER — Inpatient Hospital Stay (HOSPITAL_COMMUNITY)
Admission: AD | Admit: 2016-11-15 | Discharge: 2016-11-15 | Disposition: A | Discharge status: Home / Self Care | Payer: Self-pay | Source: Ambulatory Visit | Attending: Obstetrics and Gynecology | Admitting: Obstetrics and Gynecology

## 2016-11-15 ENCOUNTER — Ambulatory Visit (INDEPENDENT_AMBULATORY_CARE_PROVIDER_SITE_OTHER): Payer: Self-pay | Admitting: Obstetrics & Gynecology

## 2016-11-15 ENCOUNTER — Encounter: Payer: Self-pay | Admitting: Obstetrics & Gynecology

## 2016-11-15 DIAGNOSIS — Z113 Encounter for screening for infections with a predominantly sexual mode of transmission: Secondary | ICD-10-CM

## 2016-11-15 DIAGNOSIS — Z3A00 Weeks of gestation of pregnancy not specified: Secondary | ICD-10-CM | POA: Insufficient documentation

## 2016-11-15 DIAGNOSIS — O10011 Pre-existing essential hypertension complicating pregnancy, first trimester: Secondary | ICD-10-CM

## 2016-11-15 DIAGNOSIS — O169 Unspecified maternal hypertension, unspecified trimester: Secondary | ICD-10-CM | POA: Insufficient documentation

## 2016-11-15 DIAGNOSIS — O0990 Supervision of high risk pregnancy, unspecified, unspecified trimester: Secondary | ICD-10-CM | POA: Insufficient documentation

## 2016-11-15 DIAGNOSIS — Z1151 Encounter for screening for human papillomavirus (HPV): Secondary | ICD-10-CM

## 2016-11-15 DIAGNOSIS — O24919 Unspecified diabetes mellitus in pregnancy, unspecified trimester: Secondary | ICD-10-CM | POA: Insufficient documentation

## 2016-11-15 DIAGNOSIS — Z124 Encounter for screening for malignant neoplasm of cervix: Secondary | ICD-10-CM

## 2016-11-15 DIAGNOSIS — O0991 Supervision of high risk pregnancy, unspecified, first trimester: Secondary | ICD-10-CM

## 2016-11-15 DIAGNOSIS — O24111 Pre-existing diabetes mellitus, type 2, in pregnancy, first trimester: Secondary | ICD-10-CM

## 2016-11-15 DIAGNOSIS — O09521 Supervision of elderly multigravida, first trimester: Secondary | ICD-10-CM

## 2016-11-15 LAB — COMPREHENSIVE METABOLIC PANEL
ALK PHOS: 34 U/L (ref 33–115)
ALT: 13 U/L (ref 6–29)
AST: 13 U/L (ref 10–30)
Albumin: 3.7 g/dL (ref 3.6–5.1)
BUN: 8 mg/dL (ref 7–25)
CO2: 23 mmol/L (ref 20–31)
CREATININE: 0.51 mg/dL (ref 0.50–1.10)
Calcium: 9.1 mg/dL (ref 8.6–10.2)
Chloride: 105 mmol/L (ref 98–110)
Glucose, Bld: 166 mg/dL — ABNORMAL HIGH (ref 65–99)
Potassium: 4.7 mmol/L (ref 3.5–5.3)
SODIUM: 137 mmol/L (ref 135–146)
TOTAL PROTEIN: 6.3 g/dL (ref 6.1–8.1)
Total Bilirubin: 0.8 mg/dL (ref 0.2–1.2)

## 2016-11-15 LAB — HEMOGLOBIN A1C
Hgb A1c MFr Bld: 7 % — ABNORMAL HIGH (ref <5.7)
Mean Plasma Glucose: 154 mg/dL

## 2016-11-15 LAB — POCT URINALYSIS DIP (DEVICE)
Bilirubin Urine: NEGATIVE
GLUCOSE, UA: NEGATIVE mg/dL
HGB URINE DIPSTICK: NEGATIVE
KETONES UR: NEGATIVE mg/dL
Leukocytes, UA: NEGATIVE
Nitrite: NEGATIVE
PROTEIN: 100 mg/dL — AB
SPECIFIC GRAVITY, URINE: 1.025 (ref 1.005–1.030)
UROBILINOGEN UA: 0.2 mg/dL (ref 0.0–1.0)
pH: 6 (ref 5.0–8.0)

## 2016-11-15 LAB — TSH: TSH: 1.11 mIU/L

## 2016-11-15 LAB — GLUCOSE, CAPILLARY: GLUCOSE-CAPILLARY: 146 mg/dL — AB (ref 65–99)

## 2016-11-15 MED ORDER — GLYBURIDE 2.5 MG PO TABS: 2.5000 mg | ORAL_TABLET | Freq: Two times a day (BID) | ORAL | 3 refills | Status: DC

## 2016-11-15 NOTE — MAU Note (Signed)
Pt not in lobby.  

## 2016-11-15 NOTE — Patient Instructions (Signed)
COME BACK AT 1:30 TODAY FOR BLOOD PRESSURE CHECK

## 2016-11-15 NOTE — Progress Notes (Signed)
Diabetes Education: 11/15/16 Latoya Strickland is a 40 yr old female, G1P0.  Relates that she has wanted to have a baby and that her prayers have been answered.  Comes today with the baby's father.  Currently 3581w4d.  Experiencing a great deal of nausea and cramping at times.  Has a history of type 2 diabetes since age 40 years.  Typically has not taken medications nor has she checked her blood glucose.  Recent UTI lead to the discovery of her pregnancy.   Provided handout "Nutrition, Diabetes and Pregnancy." Review of the changes in blood glucose during pregnancy and the implications for the health of the mother and the baby. Stressed the need for glucose control for the effects on her blood pressure, the development of the baby, weight of the baby, need for general health and development of baby and mom. Discussed post partum measures for obtaining more normal blood glucose levels.  Recommended aiming for a long-term weight lost.  Relates that at one time she had gained to 500 lbs.  On her own she lost to 280 lbs and then down to 260 lbs when she discovered she was pregnant. Review of the factors that affect blood glucose. Stressed need for daily walking.  Prefers to walk on the treadmill.  Discussed doing 30 minutes daily or for tighter glucose control consider 20 minutes after each meal daily. Does not have pregnancy Medicaid.  Provided a True Track meter and box of strips and box lancets until her Medicaid is approved. Review of food groups, carbohydrates, effects of carbohydrates on blood glucose.  Review of carb counting and meal planning.  She typically eats 1-2 meals per day.  Drinks sweetened tea and uses sugar on her cereal.  We negotiated trying to ear regular meals in smaller amounts with snacks and to going toward the use of unsweetened tea. 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  Appeared to be yellow liquid material.  Noted that she had not eaten  this morning and had taken a BP pill about an hour ago.  C/O great deal of nausea during this pregnancy with emesis.  Blood glucose level following vomiting was 169 mg/dl.  Had been 146 mg/dl earlier. Will plan to follow as needed. Maggie May, RN, RD, LDN

## 2016-11-15 NOTE — MAU Note (Signed)
Pt not in lobby when called

## 2016-11-15 NOTE — MAU Note (Signed)
Not in lobby when called 

## 2016-11-15 NOTE — Progress Notes (Signed)
Subjective:    Latoya Strickland is a G1P0000 5869w4d being seen today for her first obstetrical visit.  Her obstetrical history is significant for advanced maternal age, obesity and CHTN uncontrolled, Type 2 DM, and unable to afford medications.. Pregnancy history fully reviewed.  This is first pregnancy.  She is very excited about becoming pregnant. She has had a lot of nausea and vomiting. She's been unable to get her Phenergan suppositories instilled as she can't afford the $4 co-pay. They have blood pressure is extremely high. She was given 300 mg of labetalol in our office. Unfortunately she vomited the medication after approximately 45 minutes.  Patient reports nausea, vomiting and mild cramping (no bleeding--has documented IUP at radiology)..  Vitals:   11/15/16 0956 11/15/16 1001 11/15/16 1337 11/15/16 1340  BP: (!) 199/113 (!) 197/96 (!) 180/89 (!) 182/89  Pulse: 75     Weight: 266 lb (120.7 kg)       HISTORY: OB History  Gravida Para Term Preterm AB Living  1 0 0 0 0 0  SAB TAB Ectopic Multiple Live Births  0 0 0 0 0    # Outcome Date GA Lbr Len/2nd Weight Sex Delivery Anes PTL Lv  1 Current              Past Medical History:  Diagnosis Date  . Arthritis   . Blood transfusion without reported diagnosis   . Carpal tunnel syndrome   . Diabetes mellitus without complication (HCC)   . DJD (degenerative joint disease)   . Hypertension   . Lumbar herniated disc   . PCOS (polycystic ovarian syndrome)    Past Surgical History:  Procedure Laterality Date  . NO PAST SURGERIES     Family History  Problem Relation Age of Onset  . Hypertension Mother   . Heart disease Mother   . Varicose Veins Mother   . Cancer Father   . Diabetes Father   . Diabetes Paternal Grandmother      Exam    Uterus:   11 weeks  Pelvic Exam:    Perineum: Hemorrhoids   Vulva: normal   Vagina:  normal mucosa   pH: n/a   Cervix: no cervical motion tenderness and no lesions   Adnexa: normal  adnexa   Bony Pelvis: average  System: Breast:  normal appearance, no masses or tenderness   Skin: normal coloration and turgor, no rashes    Neurologic: oriented, normal mood   Extremities: no deformities   HEENT oropharynx clear, no lesions, neck supple with midline trachea and thyroid without masses   Mouth/Teeth mucous membranes moist, pharynx normal without lesions   Neck supple   Cardiovascular: regular rate and rhythm   Respiratory:  appears well, vitals normal, no respiratory distress, acyanotic, normal RR, chest clear, no wheezing, crepitations, rhonchi, normal symmetric air entry   Abdomen: soft, non-tender; bowel sounds normal; no masses,  no organomegaly   Urinary: urethral meatus normal      Assessment:    Pregnancy: G1P0000 Patient Active Problem List   Diagnosis Date Noted  . Supervision of high-risk pregnancy 11/15/2016  . Hypertension in pregnancy 11/15/2016  . Diabetes in pregnancy 11/15/2016  . AMA (advanced maternal age) multigravida 35+, first trimester 11/15/2016  . Diabetes (HCC) 08/09/2016  . Essential hypertension 08/09/2016  . DJD (degenerative joint disease) 08/09/2016        Plan:     Initial labs drawn. Prenatal vitamins. Problem list reviewed and updated. Genetic Screening discussed--pt scheduled for genetic  screening.  She is AMA and should get NIPS and Afp.  Ultrasound discussed; fetal survey: ordered.  1.  Uncontrolled Hypertension-Patient was given 300 mg of labetalol in her office. She vomited after 45 minutes. Her blood pressure was still high 2 hours later. Patient is being sent to the emergency room for blood pressure control and also nausea medications. Case management should be called to make sure that the patient can afford her medications today. Patient understands there is risk of stroke with a blood pressure this high and agrees to the emergency room immediately.  Baseline labs and protein creatinine ratio was sent today. Patient  start baby aspirin at 13 weeks help prevent preeclampsia.  2.  Type 2 diabetes--patient has not been able to afford her metformin. Her fasting blood glucose today was 146. I suggest glyburide 2.5 mg twice a day. Again she is having medication issues. Patient with Latoya GraterMaggie today to review how to check her blood glucose appropriately during pregnancy.  Baseline labs including TSH and hemoglobin A1c were drawn today. Estimated above, patient will start baby aspirin.  Patient need a fetal echo and ophthalmology appointment.  3.  Maternal age--patient wants genetic testing. Nurse's referring to MFM for NIPOS and genetic counseling. AFP should be drawn between 15 and 20 weeks.  4. Obesity--patient should have 12 -20 pound weight gain for this pregnancy. Hopefully her diabetic diet will help her eat and maintain this weight gain.  5.  Financial insecurity--patient has problems reading co-pays. Case management should become involved quickly if she is unable to afford her medications today. Patient think she has enough money to afford her medications. Given that she is going to the emergency room as can be addressed further to make sure that the patient can lead with her medications and stay healthy. She has some anxiety around the situation and that should be addressed with her licensed clinical social worker at her next visit.  6.  Patient education--patient is getting a lot of Y details about what she can Do in the pregnancy. I encouraged her to download the baby scripts SHE can get information about her pregnancy that is vetted by the [facility].  Extensive amount of time was spent talking about nutrition, sleep positions, activities, diabetes, genetics, hypertension, and what to expect during the pregnancy.  7.  Bedside limited ultrasound showed a fetal heart rate of 158.   Follow up in 1 week. 50% of 60 min visit spent on counseling and coordination of care.     [name] 11/15/2016

## 2016-11-15 NOTE — Progress Notes (Signed)
Patient reports cramps & pain around navel. Patient reports occasional blurry vision/dizziness. Patient has not taken BP medication in 2 weeks. Patient states she couldn't afford new prescriptions but is getting money today to pick them up   Fasting = 146 today

## 2016-11-16 LAB — PRENATAL PROFILE (SOLSTAS)
ANTIBODY SCREEN: NEGATIVE
Basophils Absolute: 0 cells/uL (ref 0–200)
Basophils Relative: 0 %
EOS PCT: 1 %
Eosinophils Absolute: 75 cells/uL (ref 15–500)
HEMATOCRIT: 35.7 % (ref 35.0–45.0)
HIV: NONREACTIVE
Hemoglobin: 12 g/dL (ref 11.7–15.5)
Hepatitis B Surface Ag: NEGATIVE
LYMPHS PCT: 32 %
Lymphs Abs: 2400 cells/uL (ref 850–3900)
MCH: 28.6 pg (ref 27.0–33.0)
MCHC: 33.6 g/dL (ref 32.0–36.0)
MCV: 85 fL (ref 80.0–100.0)
MONO ABS: 525 {cells}/uL (ref 200–950)
MONOS PCT: 7 %
MPV: 8.6 fL (ref 7.5–12.5)
NEUTROS PCT: 60 %
Neutro Abs: 4500 cells/uL (ref 1500–7800)
Platelets: 284 10*3/uL (ref 140–400)
RBC: 4.2 MIL/uL (ref 3.80–5.10)
RDW: 14 % (ref 11.0–15.0)
RH TYPE: POSITIVE
Rubella: 2.76 Index — ABNORMAL HIGH (ref <0.90)
WBC: 7.5 10*3/uL (ref 3.8–10.8)

## 2016-11-16 LAB — PROTEIN / CREATININE RATIO, URINE
Creatinine, Urine: 262 mg/dL (ref 20–320)
PROTEIN CREATININE RATIO: 80 mg/g{creat} (ref 21–161)
Total Protein, Urine: 21 mg/dL (ref 5–24)

## 2016-11-16 LAB — CULTURE, OB URINE

## 2016-11-17 ENCOUNTER — Encounter (HOSPITAL_COMMUNITY): Payer: Self-pay | Admitting: Obstetrics & Gynecology

## 2016-11-17 LAB — CYTOLOGY - PAP
CHLAMYDIA, DNA PROBE: NEGATIVE
DIAGNOSIS: NEGATIVE
HPV (WINDOPATH): NOT DETECTED
NEISSERIA GONORRHEA: NEGATIVE

## 2016-11-20 ENCOUNTER — Encounter: Payer: Self-pay | Admitting: Obstetrics & Gynecology

## 2016-11-20 DIAGNOSIS — F1290 Cannabis use, unspecified, uncomplicated: Secondary | ICD-10-CM | POA: Insufficient documentation

## 2016-11-20 LAB — PAIN MGMT, PROFILE 6 CONF W/O MM, U
6 Acetylmorphine: NEGATIVE ng/mL (ref <10)
ALCOHOL METABOLITES: NEGATIVE ng/mL (ref <500)
Amphetamines: NEGATIVE ng/mL (ref <500)
BENZODIAZEPINES: NEGATIVE ng/mL (ref <100)
Barbiturates: NEGATIVE ng/mL (ref <300)
COCAINE METABOLITE: NEGATIVE ng/mL (ref <150)
Creatinine: 231.5 mg/dL (ref >=20.0)
MARIJUANA METABOLITE: 586 ng/mL — AB (ref <5)
MARIJUANA METABOLITE: POSITIVE ng/mL — AB (ref <20)
METHADONE METABOLITE: NEGATIVE ng/mL (ref <100)
Opiates: NEGATIVE ng/mL (ref <100)
Oxidant: NEGATIVE ug/mL (ref <200)
Oxycodone: NEGATIVE ng/mL (ref <100)
PHENCYCLIDINE: NEGATIVE ng/mL (ref <25)
PLEASE NOTE: 0
pH: 6.77 (ref 4.5–9.0)

## 2016-11-22 LAB — HEMOGLOBINOPATHY EVALUATION
HCT: 35.7 % (ref 35.0–45.0)
HGB A: 97 % (ref >96.0)
Hemoglobin: 12 g/dL (ref 11.7–15.5)
Hgb A2 Quant: 2 % (ref 1.8–3.5)
MCH: 28.6 pg (ref 27.0–33.0)
MCV: 85 fL (ref 80.0–100.0)
RBC: 4.2 MIL/uL (ref 3.80–5.10)
RDW: 14 % (ref 11.0–15.0)

## 2016-11-24 ENCOUNTER — Ambulatory Visit (HOSPITAL_COMMUNITY): Payer: Self-pay

## 2016-11-25 ENCOUNTER — Ambulatory Visit (HOSPITAL_COMMUNITY)
Admission: RE | Admit: 2016-11-25 | Discharge: 2016-11-25 | Disposition: A | Discharge status: Home / Self Care | Payer: Self-pay | Source: Ambulatory Visit | Attending: Obstetrics & Gynecology | Admitting: Obstetrics & Gynecology

## 2016-11-25 ENCOUNTER — Ambulatory Visit (HOSPITAL_COMMUNITY): Payer: Self-pay

## 2016-11-25 ENCOUNTER — Other Ambulatory Visit: Payer: Self-pay | Admitting: Obstetrics & Gynecology

## 2016-11-25 ENCOUNTER — Encounter (HOSPITAL_COMMUNITY): Payer: Self-pay

## 2016-11-25 ENCOUNTER — Ambulatory Visit (INDEPENDENT_AMBULATORY_CARE_PROVIDER_SITE_OTHER): Payer: Self-pay | Admitting: Family Medicine

## 2016-11-25 VITALS — BP 195/105 | HR 67 | Wt 261.0 lb

## 2016-11-25 DIAGNOSIS — O09521 Supervision of elderly multigravida, first trimester: Secondary | ICD-10-CM

## 2016-11-25 DIAGNOSIS — Z3A13 13 weeks gestation of pregnancy: Secondary | ICD-10-CM

## 2016-11-25 DIAGNOSIS — O0991 Supervision of high risk pregnancy, unspecified, first trimester: Secondary | ICD-10-CM

## 2016-11-25 DIAGNOSIS — O10011 Pre-existing essential hypertension complicating pregnancy, first trimester: Secondary | ICD-10-CM

## 2016-11-25 DIAGNOSIS — Z3682 Encounter for antenatal screening for nuchal translucency: Secondary | ICD-10-CM

## 2016-11-25 DIAGNOSIS — O0992 Supervision of high risk pregnancy, unspecified, second trimester: Secondary | ICD-10-CM

## 2016-11-25 DIAGNOSIS — O24414 Gestational diabetes mellitus in pregnancy, insulin controlled: Secondary | ICD-10-CM | POA: Insufficient documentation

## 2016-11-25 DIAGNOSIS — O24111 Pre-existing diabetes mellitus, type 2, in pregnancy, first trimester: Secondary | ICD-10-CM

## 2016-11-25 DIAGNOSIS — Z315 Encounter for genetic counseling: Secondary | ICD-10-CM | POA: Insufficient documentation

## 2016-11-25 DIAGNOSIS — O09511 Supervision of elderly primigravida, first trimester: Secondary | ICD-10-CM | POA: Insufficient documentation

## 2016-11-25 HISTORY — DX: Unspecified osteoarthritis, unspecified site: M19.90

## 2016-11-25 LAB — POCT URINALYSIS DIP (DEVICE)
BILIRUBIN URINE: NEGATIVE
Glucose, UA: NEGATIVE mg/dL
KETONES UR: NEGATIVE mg/dL
LEUKOCYTES UA: NEGATIVE
Nitrite: NEGATIVE
Protein, ur: 30 mg/dL — AB
Urobilinogen, UA: 0.2 mg/dL (ref 0.0–1.0)
pH: 6 (ref 5.0–8.0)

## 2016-11-25 MED ORDER — NIFEDIPINE ER OSMOTIC RELEASE 30 MG PO TB24: 30.0000 mg | ORAL_TABLET | Freq: Two times a day (BID) | ORAL | 3 refills | Status: DC

## 2016-11-25 NOTE — Progress Notes (Signed)
Genetic Counseling  High-Risk Gestation Note  Appointment Date:  11/25/2016 Referred By: Latoya DukesLeggett, Kelly H, MD Date of Birth:  02/01/1976 Partner:  Latoya Strickland   Pregnancy History: G1P0000 Estimated Date of Delivery: 06/02/17 Estimated Gestational Age: 769w0d Attending: Alpha GulaPaul Whitecar, MD   Latoya Strickland and her partner, Latoya Strickland, were seen for genetic counseling because of a maternal age of 40 y.o..     In summary:  Discussed AMA and associated risk for fetal aneuploidy  Discussed options for screening  First screen-declined  Quad screen-declined  NIPS-declined, patient requested follow-up genetic counseling at time of anatomy ultrasound to review this option again  Ultrasound- NT attempt unsuccessful, detailed ultrasound scheduled 12/30/16  Discussed diagnostic testing options  Amniocentesis-declined  Discussed carrier screening options  CF - declined  SMA- declined  Hemoglobinopathies- hemoglobin electrophoresis within normal range through OB office  They were counseled regarding maternal age and the association with risk for chromosome conditions due to nondisjunction with aging of the ova.   We reviewed chromosomes, nondisjunction, and the associated 1 in 3151 risk for fetal aneuploidy related to a maternal age of 40 y.o. at 659w0d gestation.  They were counseled that the risk for aneuploidy decreases as gestational age increases, accounting for those pregnancies which spontaneously abort.  We specifically discussed Down syndrome (trisomy 8921), trisomies 1513 and 2318, and sex chromosome aneuploidies (47,XXX and 47,XXY) including the common features and prognoses of each.   We reviewed available screening options including First Screen, Quad screen, noninvasive prenatal screening (NIPS)/cell free DNA (cfDNA) screening, and detailed ultrasound.  They were counseled that screening tests are used to modify a patient's a priori risk for aneuploidy, typically based on  age. This estimate provides a pregnancy specific risk assessment. We reviewed the benefits and limitations of each option. Specifically, we discussed the conditions for which each test screens, the detection rates, and false positive rates of each. They were also counseled regarding diagnostic testing via amniocentesis. We reviewed the approximate 1 in 300-500 risk for complications from amniocentesis, including spontaneous pregnancy loss. We discussed the possible results that the tests might provide including: positive, negative, unanticipated, and no result. Finally, they were counseled regarding the cost of each option and potential out of pocket expenses. After consideration of all the options, [name] declined additional screening for fetal aneuploidy, stating that it would not her alter her pregnancy course, she is comfortable with the age related risk assessment, and screening for these chromosome conditions would potentially increase maternal stress during the pregnancy for her.     A nuchal translucency ultrasound was attempted today but was unable to be obtained.  The report will be documented separately.  The patient would like to return for a detailed ultrasound at ~18+ weeks gestation.  This appointment was scheduled for 12/30/16. [name] requested follow-up genetic counseling at the time of this ultrasound to review the screening options, specifically NIPS.  She understands that screening tests cannot rule out all birth defects or genetic syndromes. The patient was advised of this limitation and states she still does not want additional testing at this time.    [name] was provided with written information regarding cystic fibrosis (CF), spinal muscular atrophy (SMA) and hemoglobinopathies including the carrier frequency, availability of carrier screening and prenatal diagnosis if indicated.  In addition, we discussed that CF and hemoglobinopathies are routinely screened for as  part of the [facility] newborn screening panel. Hemoglobin electrophoresis was within normal range through her OB  provider.  After further discussion, she declined screening for CF and SMA.  Detailed family history construction was not performed at the time of today's visit, given the nature of today's discussion and time constraints. The couple reported no family history of birth defects or genetic conditions.  Without further information regarding the provided family history, an accurate genetic risk cannot be calculated. Further genetic counseling is warranted if more information is obtained.   [name] denied exposure to environmental toxins or chemical agents. She denied the use of alcohol, tobacco or street drugs. She denied significant viral illnesses during the course of her pregnancy. Her medical and surgical histories were contributory for diabetes and hypertension.   I counseled this couple regarding the above risks and available options.  The approximate face-to-face time with the genetic counselor was 40 minutes.  Latoya PlowmanKaren Kaisa Wofford, MS,  Certified Genetic Counselor 11/25/2016

## 2016-11-25 NOTE — Progress Notes (Signed)
   PRENATAL VISIT NOTE  Subjective:  Seward Graterngela Woodstock is a 40 y.o. G1P0000 at 8379w0d being seen today for ongoing prenatal care.  She is currently monitored for the following issues for this high-risk pregnancy and has Diabetes (HCC); Essential hypertension; DJD (degenerative joint disease); Supervision of high-risk pregnancy; Hypertension in pregnancy; Diabetes in pregnancy; AMA (advanced maternal age) multigravida 35+, first trimester; and Marijuana use on her problem list.  Patient reports no complaints.  Contractions: Not present. Vag. Bleeding: None.  Movement: Present. Denies leaking of fluid.   Patient hasn't been taking blood sugars correctly.  Denies headache, blurred vision. States that she doesn't want to be on medication. Asked about herbal medications to take for her blood pressure.He states that the 300 mg of labetalol that Dr. Penne LashLeggett wanted her to take was too high for her body, and that she's never taken 300 mg of anything before.  The following portions of the patient's history were reviewed and updated as appropriate: allergies, current medications, past family history, past medical history, past social history, past surgical history and problem list. Problem list updated.  Objective:   Vitals:   11/25/16 0803 11/25/16 0817  BP: (!) 199/97 (!) 195/105  Pulse: 67   Weight: 261 lb (118.4 kg)     Fetal Status: Fetal Heart Rate (bpm): 153   Movement: Present     General:  Alert, oriented and cooperative. Patient is in no acute distress.  Skin: Skin is warm and dry. No rash noted.   Cardiovascular: Normal heart rate noted  Respiratory: Normal respiratory effort, no problems with respiration noted  Abdomen: Soft, gravid, appropriate for gestational age. Pain/Pressure: Absent     Pelvic:  Cervical exam deferred        Extremities: Normal range of motion.  Edema: None  Mental Status: Normal mood and affect. Normal behavior. Normal judgment and thought content.   Assessment  and Plan:  Pregnancy: G1P0000 at 1179w0d  1. Supervision of high risk pregnancy in second trimester FHT normal  2. AMA (advanced maternal age) multigravida 35+, first trimester  3. Pre-existing type 2 diabetes mellitus during pregnancy in first trimester I discussed with her when to take her blood sugars. I also discussed the range of her blood sugars. I discussed her HgA1c, which is 7.0, giving her an average of 166, which is too high. Pt to return Monday for follow up  4. Pre-existing essential hypertension during pregnancy in first trimester Start procardia xl 30mg  BID. I discussed complications of hypertension: IUGR, Stroke, MI, renal failure. As patient is asymptomatic and blood work last visit showed no end organ damage, will start blood pressure medication. I discussed with patient that we may need to increase the Procardia, as well as start her on labetalol, which she seemed to accept. Follow-up Monday for blood pressure check   Preterm labor symptoms and general obstetric precautions including but not limited to vaginal bleeding, contractions, leaking of fluid and fetal movement were reviewed in detail with the patient. Please refer to After Visit Summary for other counseling recommendations.  No Follow-up on file.   Levie HeritageJacob J Zhion Pevehouse, DO

## 2016-11-25 NOTE — Progress Notes (Signed)
Patient c/o headaches, no dizziness, blurred vision, or abd pain. Urine, trace blood

## 2016-11-29 ENCOUNTER — Ambulatory Visit (INDEPENDENT_AMBULATORY_CARE_PROVIDER_SITE_OTHER): Payer: Self-pay | Admitting: Obstetrics & Gynecology

## 2016-11-29 VITALS — BP 130/62 | HR 74 | Wt 264.4 lb

## 2016-11-29 DIAGNOSIS — O2240 Hemorrhoids in pregnancy, unspecified trimester: Secondary | ICD-10-CM | POA: Insufficient documentation

## 2016-11-29 DIAGNOSIS — O0992 Supervision of high risk pregnancy, unspecified, second trimester: Secondary | ICD-10-CM

## 2016-11-29 DIAGNOSIS — O24111 Pre-existing diabetes mellitus, type 2, in pregnancy, first trimester: Secondary | ICD-10-CM

## 2016-11-29 DIAGNOSIS — I10 Essential (primary) hypertension: Secondary | ICD-10-CM

## 2016-11-29 DIAGNOSIS — O2242 Hemorrhoids in pregnancy, second trimester: Secondary | ICD-10-CM

## 2016-11-29 LAB — POCT URINALYSIS DIP (DEVICE)
GLUCOSE, UA: 100 mg/dL — AB
HGB URINE DIPSTICK: NEGATIVE
LEUKOCYTES UA: NEGATIVE
NITRITE: NEGATIVE
Protein, ur: NEGATIVE mg/dL
Specific Gravity, Urine: 1.025 (ref 1.005–1.030)
UROBILINOGEN UA: 0.2 mg/dL (ref 0.0–1.0)
pH: 5.5 (ref 5.0–8.0)

## 2016-11-29 MED ORDER — HYDROCORTISONE ACETATE 25 MG RE SUPP: 25.0000 mg | Freq: Two times a day (BID) | RECTAL | 1 refills | Status: DC

## 2016-11-29 NOTE — Progress Notes (Signed)
   PRENATAL VISIT NOTE  Subjective:  Latoya Strickland is a 40 y.o. G1P0000 at [redacted]w[redacted]d being seen today for ongoing prenatal care.  She is currently monitored for the following issues for this high-risk pregnancy and has Diabetes (HCC); Essential hypertension; DJD (degenerative joint disease); Supervision of high-risk pregnancy; Hypertension in pregnancy; Diabetes in pregnancy; AMA (advanced maternal age) multigravida 35+, first trimester; Marijuana use; [redacted] weeks gestation of pregnancy; and Hemorrhoids during pregnancy on her problem list.  Patient reports hemorrhoid bleeding.  Contractions: Not present. Vag. Bleeding: Other.  Movement: Present. Denies leaking of fluid.   The following portions of the patient's history were reviewed and updated as appropriate: allergies, current medications, past family history, past medical history, past social history, past surgical history and problem list. Problem list updated.  Objective:   Vitals:   11/29/16 1433  BP: 130/62  Pulse: 74  Weight: 264 lb 6.4 oz (119.9 kg)    Fetal Status: Fetal Heart Rate (bpm): 156   Movement: Present     General:  Alert, oriented and cooperative. Patient is in no acute distress.  Skin: Skin is warm and dry. No rash noted.   Cardiovascular: Normal heart rate noted  Respiratory: Normal respiratory effort, no problems with respiration noted  Abdomen: Soft, gravid, appropriate for gestational age. Pain/Pressure: Present     Pelvic:  Cervical exam deferred        Extremities: Normal range of motion.  Edema: None  Mental Status: Normal mood and affect. Normal behavior. Normal judgment and thought content.   Assessment and Plan:  Pregnancy: G1P0000 at 264w4d  1. Essential hypertension Need to start Procardia  2. Supervision of high risk pregnancy in second trimester   3. Pre-existing type 2 diabetes mellitus during pregnancy in first trimester Bring her book next visit, state FBS >100  4. Hemorrhoids during  pregnancy in second trimester Change her therapy - hydrocortisone (ANUSOL-HC) 25 MG suppository; Place 1 suppository (25 mg total) rectally 2 (two) times daily.  Dispense: 12 suppository; Refill: 1 General obstetric precautions including but not limited to vaginal bleeding, contractions, leaking of fluid and fetal movement were reviewed in detail with the patient. Please refer to After Visit Summary for other counseling recommendations.  Return in about 2 weeks (around 12/13/2016).   Adam PhenixJames G Arnold, MD

## 2016-11-29 NOTE — Progress Notes (Signed)
States hasn't been able to get procardia yet because of money - but is getting it today. States has hemorrhoids - and saw some blood when she wiped.

## 2016-12-23 ENCOUNTER — Ambulatory Visit (INDEPENDENT_AMBULATORY_CARE_PROVIDER_SITE_OTHER): Payer: Self-pay | Admitting: Obstetrics and Gynecology

## 2016-12-23 VITALS — BP 174/97 | HR 81 | Wt 273.0 lb

## 2016-12-23 DIAGNOSIS — O09521 Supervision of elderly multigravida, first trimester: Secondary | ICD-10-CM

## 2016-12-23 DIAGNOSIS — O09522 Supervision of elderly multigravida, second trimester: Secondary | ICD-10-CM

## 2016-12-23 DIAGNOSIS — O0992 Supervision of high risk pregnancy, unspecified, second trimester: Secondary | ICD-10-CM

## 2016-12-23 DIAGNOSIS — E139 Other specified diabetes mellitus without complications: Secondary | ICD-10-CM

## 2016-12-23 DIAGNOSIS — O10012 Pre-existing essential hypertension complicating pregnancy, second trimester: Secondary | ICD-10-CM

## 2016-12-23 DIAGNOSIS — O24112 Pre-existing diabetes mellitus, type 2, in pregnancy, second trimester: Secondary | ICD-10-CM

## 2016-12-23 DIAGNOSIS — I10 Essential (primary) hypertension: Secondary | ICD-10-CM

## 2016-12-23 NOTE — Progress Notes (Signed)
   PRENATAL VISIT NOTE  Subjective:  Latoya Strickland is a 41 y.o. G1P0000 at 7867w0d being seen today for ongoing prenatal care.  She is currently monitored for the following issues for this high-risk pregnancy and has Diabetes (HCC); Essential hypertension; DJD (degenerative joint disease); Supervision of high-risk pregnancy; Hypertension in pregnancy; Diabetes in pregnancy; AMA (advanced maternal age) multigravida 35+, first trimester; Marijuana use; and Hemorrhoids during pregnancy on her problem list.  Patient reports pelvic pressure with movement and sneezing.  Contractions: Not present. Vag. Bleeding: None.  Movement: Present. Denies leaking of fluid.   The following portions of the patient's history were reviewed and updated as appropriate: allergies, current medications, past family history, past medical history, past social history, past surgical history and problem list. Problem list updated.  Objective:   Vitals:   12/23/16 1321 12/23/16 1324  BP: (!) 175/113 (!) 174/97  Pulse: 83 81  Weight: 273 lb (123.8 kg)     Fetal Status: Fetal Heart Rate (bpm): 152   Movement: Present     General:  Alert, oriented and cooperative. Patient is in no acute distress.  Skin: Skin is warm and dry. No rash noted.   Cardiovascular: Normal heart rate noted  Respiratory: Normal respiratory effort, no problems with respiration noted  Abdomen: Soft, gravid, appropriate for gestational age. Pain/Pressure: Present     Pelvic:  Cervical exam deferred        Extremities: Normal range of motion.  Edema: Trace  Mental Status: Normal mood and affect. Normal behavior. Normal judgment and thought content.   Assessment and Plan:  Pregnancy: G1P0000 at 6367w0d  1. Supervision of high risk pregnancy in second trimester Reassurance provided regarding aches and pains in pregancy Follow up anatomy ultrasound on 1/11  2. AMA (advanced maternal age) multigravida 35+, first trimester Scheduled for genetic  counseling on 1/11  3. Pre-existing type 2 diabetes mellitus during pregnancy in second trimester Patient has not been taking glyburide. She reports checking CBGs but has not been documenting. She does not recall what her values are Reviewed the importance of compliance with medical plan  4. Pre-existing essential hypertension during pregnancy in second trimester Patient has not been taking procardia Discussed again the risks of strokes, IUGR, abruption, and neonatal demise associated with uncontrolled HTN in pregnancy She agrees to restarting procardia.  RTC on 1/11 for BP check  5. Diabetes mellitus of other type without complication, unspecified long term insulin use status (HCC)   6. Essential hypertension   Preterm labor symptoms and general obstetric precautions including but not limited to vaginal bleeding, contractions, leaking of fluid and fetal movement were reviewed in detail with the patient. Please refer to After Visit Summary for other counseling recommendations.  No Follow-up on file.   Catalina AntiguaPeggy Kaylan Friedmann, MD

## 2016-12-30 ENCOUNTER — Other Ambulatory Visit: Payer: Self-pay | Admitting: Obstetrics & Gynecology

## 2016-12-30 ENCOUNTER — Encounter (HOSPITAL_COMMUNITY): Payer: Self-pay

## 2016-12-30 ENCOUNTER — Other Ambulatory Visit (HOSPITAL_COMMUNITY): Payer: Self-pay | Admitting: *Deleted

## 2016-12-30 ENCOUNTER — Ambulatory Visit: Payer: Self-pay | Admitting: *Deleted

## 2016-12-30 ENCOUNTER — Ambulatory Visit (HOSPITAL_COMMUNITY): Admission: RE | Admit: 2016-12-30 | Payer: Self-pay | Source: Ambulatory Visit

## 2016-12-30 ENCOUNTER — Ambulatory Visit (HOSPITAL_COMMUNITY)
Admission: RE | Admit: 2016-12-30 | Discharge: 2016-12-30 | Disposition: A | Discharge status: Home / Self Care | Payer: Self-pay | Source: Ambulatory Visit | Attending: Obstetrics & Gynecology | Admitting: Obstetrics & Gynecology

## 2016-12-30 VITALS — BP 162/93

## 2016-12-30 DIAGNOSIS — Z0130 Encounter for examination of blood pressure without abnormal findings: Secondary | ICD-10-CM

## 2016-12-30 DIAGNOSIS — O10012 Pre-existing essential hypertension complicating pregnancy, second trimester: Secondary | ICD-10-CM

## 2016-12-30 DIAGNOSIS — O10011 Pre-existing essential hypertension complicating pregnancy, first trimester: Secondary | ICD-10-CM

## 2016-12-30 DIAGNOSIS — O09512 Supervision of elderly primigravida, second trimester: Secondary | ICD-10-CM

## 2016-12-30 DIAGNOSIS — Z3A18 18 weeks gestation of pregnancy: Secondary | ICD-10-CM

## 2016-12-30 DIAGNOSIS — O24112 Pre-existing diabetes mellitus, type 2, in pregnancy, second trimester: Secondary | ICD-10-CM

## 2016-12-30 DIAGNOSIS — O09521 Supervision of elderly multigravida, first trimester: Secondary | ICD-10-CM

## 2016-12-30 DIAGNOSIS — O99212 Obesity complicating pregnancy, second trimester: Secondary | ICD-10-CM | POA: Insufficient documentation

## 2016-12-30 DIAGNOSIS — O24312 Unspecified pre-existing diabetes mellitus in pregnancy, second trimester: Secondary | ICD-10-CM | POA: Insufficient documentation

## 2016-12-30 DIAGNOSIS — O0991 Supervision of high risk pregnancy, unspecified, first trimester: Secondary | ICD-10-CM

## 2016-12-30 DIAGNOSIS — O24111 Pre-existing diabetes mellitus, type 2, in pregnancy, first trimester: Secondary | ICD-10-CM

## 2016-12-30 NOTE — Progress Notes (Signed)
Patient presents for blood pressure check. BP 162/93, patient said she has been taking her procardia, but she didn't take it today because she had an u/s this morning and wasn't sure if she could. No headache, blurred vision or abd pain. No edema. Spoke with Dr Marice Potterove regarding patient, she said patient needed to keep taking her medicine and be seen in one week. Patient is schedule with Dr Jolayne Pantheronstant already next week.  Told patient to take her medicine when she gets home and to take it every day unless told otherwise. Follow up with Dr Jolayne Pantheronstant next week. Understanding voiced.

## 2017-01-03 ENCOUNTER — Other Ambulatory Visit: Payer: Self-pay | Admitting: Obstetrics & Gynecology

## 2017-01-03 MED ORDER — ASPIRIN EC 81 MG PO TBEC: 81.0000 mg | DELAYED_RELEASE_TABLET | Freq: Every day | ORAL | 7 refills | Status: DC

## 2017-01-03 NOTE — Progress Notes (Signed)
RN to call pt to make sure she is taking ASA daily to prevent pre E.

## 2017-01-04 ENCOUNTER — Telehealth: Payer: Self-pay | Admitting: General Practice

## 2017-01-04 NOTE — Telephone Encounter (Signed)
Per Dr Penne LashLeggett, patient should be on baby aspirin and med has been sent to pharmacy. Called patient and informed her. Patient verbalized understanding & asked why she had a pain in her belly button that woke her up out of her sleep. Told patient that is a common pain pregnant women report around this time in pregnancy. Patient verbalized understanding & had no questions

## 2017-01-06 ENCOUNTER — Ambulatory Visit (INDEPENDENT_AMBULATORY_CARE_PROVIDER_SITE_OTHER): Payer: Self-pay | Admitting: Obstetrics and Gynecology

## 2017-01-06 VITALS — BP 150/93 | HR 82

## 2017-01-06 DIAGNOSIS — O4400 Placenta previa specified as without hemorrhage, unspecified trimester: Secondary | ICD-10-CM

## 2017-01-06 DIAGNOSIS — E139 Other specified diabetes mellitus without complications: Secondary | ICD-10-CM

## 2017-01-06 DIAGNOSIS — O0992 Supervision of high risk pregnancy, unspecified, second trimester: Secondary | ICD-10-CM

## 2017-01-06 DIAGNOSIS — O4402 Placenta previa specified as without hemorrhage, second trimester: Secondary | ICD-10-CM

## 2017-01-06 DIAGNOSIS — O24112 Pre-existing diabetes mellitus, type 2, in pregnancy, second trimester: Secondary | ICD-10-CM

## 2017-01-06 DIAGNOSIS — O09522 Supervision of elderly multigravida, second trimester: Secondary | ICD-10-CM

## 2017-01-06 DIAGNOSIS — O09521 Supervision of elderly multigravida, first trimester: Secondary | ICD-10-CM

## 2017-01-06 DIAGNOSIS — O10012 Pre-existing essential hypertension complicating pregnancy, second trimester: Secondary | ICD-10-CM

## 2017-01-06 MED ORDER — ACCU-CHEK NANO SMARTVIEW W/DEVICE KIT: 1.0000 | PACK | 0 refills | Status: AC

## 2017-01-06 NOTE — Progress Notes (Signed)
   PRENATAL VISIT NOTE  Subjective:  Latoya Strickland is a 41 y.o. G1P0000 at 4130w0d being seen today for ongoing prenatal care.  She is currently monitored for the following issues for this high-risk pregnancy and has Diabetes (HCC); Essential hypertension; DJD (degenerative joint disease); Supervision of high-risk pregnancy; Hypertension in pregnancy; Diabetes in pregnancy; AMA (advanced maternal age) multigravida 35+, first trimester; Marijuana use; Hemorrhoids during pregnancy; and Placenta previa antepartum on her problem list.  Patient reports no complaints.  Contractions: Not present.  .  Movement: Present. Denies leaking of fluid.   The following portions of the patient's history were reviewed and updated as appropriate: allergies, current medications, past family history, past medical history, past social history, past surgical history and problem list. Problem list updated.  Objective:   Vitals:   01/06/17 1509  BP: (!) 150/93  Pulse: 82    Fetal Status: Fetal Heart Rate (bpm): 170   Movement: Present     General:  Alert, oriented and cooperative. Patient is in no acute distress.  Skin: Skin is warm and dry. No rash noted.   Cardiovascular: Normal heart rate noted  Respiratory: Normal respiratory effort, no problems with respiration noted  Abdomen: Soft, gravid, appropriate for gestational age. Pain/Pressure: Present     Pelvic:  Cervical exam deferred        Extremities: Normal range of motion.  Edema: None  Mental Status: Normal mood and affect. Normal behavior. Normal judgment and thought content.   Assessment and Plan:  Pregnancy: G1P0000 at 8230w0d  1. Pre-existing essential hypertension during pregnancy in second trimester BP improved from previous visit. Patient is taking her procardia but admits to not taking it everyday  2. Diabetes mellitus of other type without complication, unspecified long term insulin use status (HCC) Patient reports taking her glyburide but  has not been checking CBG because her meter is not working [employer] Rx provided. Will have the patient return in 2 weeks in order to review CBG Fetal echo ordered - US Fetal Echocardiography; Future  3. Pre-existing type 2 diabetes mellitus during pregnancy in second trimester  - US Fetal Echocardiography; Future  4. AMA (advanced maternal age) multigravida 35+, first trimester Patient declined all testing  5. Supervision of high risk pregnancy in second trimester Patient is doing well without complaints Ultrasound findings reviewed with the patient. Informed patient of placenta previa. Advised to abstain from intercourse. Follow up ultrasound scheduled on 01/27/2017  6. Placenta previa antepartum   General obstetric precautions including but not limited to vaginal bleeding, contractions, leaking of fluid and fetal movement were reviewed in detail with the patient. Please refer to After Visit Summary for other counseling recommendations.  No Follow-up on file.   Catalina AntiguaPeggy Demiah Gullickson, MD

## 2017-01-07 ENCOUNTER — Telehealth: Payer: Self-pay

## 2017-01-07 NOTE — Telephone Encounter (Signed)
Patient was returning call 

## 2017-01-07 NOTE — Telephone Encounter (Signed)
Patient has been scheduled for fetal echo with Dr.Cotton on 02/03/2017 @ 11:00. Called patient no answer or voicemail to leave a message.

## 2017-01-10 NOTE — Telephone Encounter (Signed)
I called Latoya Strickland and left a message we were trying to reach you with some information about an appointment we made. Please call our office.

## 2017-01-11 ENCOUNTER — Emergency Department (HOSPITAL_COMMUNITY)
Admission: EM | Admit: 2017-01-11 | Discharge: 2017-01-11 | Discharge status: Absent without leave | Payer: No Typology Code available for payment source

## 2017-01-11 ENCOUNTER — Encounter (HOSPITAL_COMMUNITY): Payer: Self-pay | Admitting: *Deleted

## 2017-01-11 ENCOUNTER — Inpatient Hospital Stay (HOSPITAL_COMMUNITY)
Admission: AD | Admit: 2017-01-11 | Discharge: 2017-01-11 | Disposition: A | Discharge status: Home / Self Care | Payer: Self-pay | Source: Ambulatory Visit | Attending: Family Medicine | Admitting: Family Medicine

## 2017-01-11 DIAGNOSIS — O99612 Diseases of the digestive system complicating pregnancy, second trimester: Secondary | ICD-10-CM | POA: Insufficient documentation

## 2017-01-11 DIAGNOSIS — O0992 Supervision of high risk pregnancy, unspecified, second trimester: Secondary | ICD-10-CM

## 2017-01-11 DIAGNOSIS — E119 Type 2 diabetes mellitus without complications: Secondary | ICD-10-CM | POA: Insufficient documentation

## 2017-01-11 DIAGNOSIS — O4400 Placenta previa specified as without hemorrhage, unspecified trimester: Secondary | ICD-10-CM

## 2017-01-11 DIAGNOSIS — Z3A19 19 weeks gestation of pregnancy: Secondary | ICD-10-CM | POA: Insufficient documentation

## 2017-01-11 DIAGNOSIS — O26892 Other specified pregnancy related conditions, second trimester: Secondary | ICD-10-CM

## 2017-01-11 DIAGNOSIS — O10012 Pre-existing essential hypertension complicating pregnancy, second trimester: Secondary | ICD-10-CM | POA: Insufficient documentation

## 2017-01-11 DIAGNOSIS — Z7982 Long term (current) use of aspirin: Secondary | ICD-10-CM | POA: Insufficient documentation

## 2017-01-11 DIAGNOSIS — Z7984 Long term (current) use of oral hypoglycemic drugs: Secondary | ICD-10-CM | POA: Insufficient documentation

## 2017-01-11 DIAGNOSIS — O24112 Pre-existing diabetes mellitus, type 2, in pregnancy, second trimester: Secondary | ICD-10-CM | POA: Insufficient documentation

## 2017-01-11 DIAGNOSIS — O4402 Placenta previa specified as without hemorrhage, second trimester: Secondary | ICD-10-CM | POA: Insufficient documentation

## 2017-01-11 DIAGNOSIS — R12 Heartburn: Secondary | ICD-10-CM | POA: Insufficient documentation

## 2017-01-11 LAB — RAPID URINE DRUG SCREEN, HOSP PERFORMED
AMPHETAMINES: NOT DETECTED
BARBITURATES: NOT DETECTED
BENZODIAZEPINES: NOT DETECTED
Cocaine: NOT DETECTED
Opiates: NOT DETECTED
TETRAHYDROCANNABINOL: POSITIVE — AB

## 2017-01-11 LAB — CBC WITH DIFFERENTIAL/PLATELET
Basophils Absolute: 0 10*3/uL (ref 0.0–0.1)
Basophils Relative: 0 %
Eosinophils Absolute: 0.1 10*3/uL (ref 0.0–0.7)
Eosinophils Relative: 1 %
HEMATOCRIT: 32.7 % — AB (ref 36.0–46.0)
HEMOGLOBIN: 11.5 g/dL — AB (ref 12.0–15.0)
LYMPHS ABS: 2.6 10*3/uL (ref 0.7–4.0)
LYMPHS PCT: 28 %
MCH: 29.5 pg (ref 26.0–34.0)
MCHC: 35.2 g/dL (ref 30.0–36.0)
MCV: 83.8 fL (ref 78.0–100.0)
MONOS PCT: 3 %
Monocytes Absolute: 0.3 10*3/uL (ref 0.1–1.0)
NEUTROS ABS: 6.6 10*3/uL (ref 1.7–7.7)
NEUTROS PCT: 68 %
Platelets: 277 10*3/uL (ref 150–400)
RBC: 3.9 MIL/uL (ref 3.87–5.11)
RDW: 13.6 % (ref 11.5–15.5)
WBC: 9.5 10*3/uL (ref 4.0–10.5)

## 2017-01-11 LAB — COMPREHENSIVE METABOLIC PANEL
ALBUMIN: 3.8 g/dL (ref 3.5–5.0)
ALT: 14 U/L (ref 14–54)
AST: 13 U/L — ABNORMAL LOW (ref 15–41)
Alkaline Phosphatase: 38 U/L (ref 38–126)
Anion gap: 9 (ref 5–15)
BUN: 9 mg/dL (ref 6–20)
CHLORIDE: 103 mmol/L (ref 101–111)
CO2: 21 mmol/L — AB (ref 22–32)
Calcium: 9.1 mg/dL (ref 8.9–10.3)
Creatinine, Ser: 0.52 mg/dL (ref 0.44–1.00)
GFR calc Af Amer: >60 mL/min (ref >60)
GFR calc non Af Amer: >60 mL/min (ref >60)
GLUCOSE: 152 mg/dL — AB (ref 65–99)
Potassium: 4.2 mmol/L (ref 3.5–5.1)
SODIUM: 133 mmol/L — AB (ref 135–145)
Total Bilirubin: 0.6 mg/dL (ref 0.3–1.2)
Total Protein: 7.2 g/dL (ref 6.5–8.1)

## 2017-01-11 LAB — URINALYSIS, ROUTINE W REFLEX MICROSCOPIC
BILIRUBIN URINE: NEGATIVE
Glucose, UA: NEGATIVE mg/dL
HGB URINE DIPSTICK: NEGATIVE
Ketones, ur: 20 mg/dL — AB
Leukocytes, UA: NEGATIVE
NITRITE: NEGATIVE
PROTEIN: 100 mg/dL — AB
SPECIFIC GRAVITY, URINE: 1.02 (ref 1.005–1.030)
pH: 5 (ref 5.0–8.0)

## 2017-01-11 LAB — LIPASE, BLOOD: Lipase: 21 U/L (ref 11–51)

## 2017-01-11 LAB — GLUCOSE, CAPILLARY: GLUCOSE-CAPILLARY: 142 mg/dL — AB (ref 65–99)

## 2017-01-11 MED ORDER — GI COCKTAIL ~~LOC~~
30.0000 mL | Freq: Once | ORAL | Status: AC
  Administered 2017-01-11: 30 mL via ORAL
  Filled 2017-01-11: qty 30

## 2017-01-11 MED ORDER — OXYCODONE-ACETAMINOPHEN 5-325 MG PO TABS: 1.0000 | ORAL_TABLET | Freq: Once | ORAL | Status: DC

## 2017-01-11 MED ORDER — RANITIDINE HCL 150 MG PO TABS: 150.0000 mg | ORAL_TABLET | Freq: Two times a day (BID) | ORAL | 0 refills | Status: DC | PRN

## 2017-01-11 NOTE — MAU Note (Signed)
Increase pain in abd, esp around belly button.  Has been told this is normal stretching, but she states it hurts so bad, feels like muscles are disconnecting, doesn't see how this can be normal.  Some vomiting, denies constipation or diarrhea.

## 2017-01-11 NOTE — MAU Provider Note (Signed)
Chief Complaint: Abdominal Pain and Emesis During Pregnancy   SUBJECTIVE HPI: Latoya Strickland is a 41 y.o. G1P0000 at 51w5dwho presents to Maternity Admissions reporting abdominal pain at belly button.  About 1 week ago, stating she has been having intermittent abdominal pain located in umbilicus specifically (not region). Pain is described as "if someone was kicking me, cutting it, or it broke apart and is moving around". Denies F/C. Denies constipation or diarrhea. Feels nauseous, spitting, but no stomach contents. Has no history of abdominal surgery. Never had pain like this before. Nothing helps make it better. Touching it makes it worse, movement when laying down makes it worse.   Has known chronic hypertension, has not picked up her medication due to she is unable to pay for it. Denies HAs, changes in vision today (states occasionally), states occasional twitches. Patient also has known type II diabetes before pregnancy- supposed to be taking glyburide but has not been taking due to no money. Does not smoke. Used to drink alcohol, not after finding out she was pregnant.   Denies VB, denies cramping. +FM already.     Past Medical History:  Diagnosis Date  . Arthritis   . Blood transfusion without reported diagnosis   . Carpal tunnel syndrome   . Degenerative joint disease   . [place]   . DJD (degenerative joint disease)   . Hypertension   . Lumbar herniated disc   . PCOS (polycystic ovarian syndrome)    OB History  Gravida Para Term Preterm AB Living  1 0 0 0 0 0  SAB TAB Ectopic Multiple Live Births  0 0 0 0 0    # Outcome Date GA Lbr Len/2nd Weight Sex Delivery Anes PTL Lv  1 Current              Past Surgical History:  Procedure Laterality Date  . WISDOM TOOTH EXTRACTION     Social History   Social History  . Marital status: Single    Spouse name: N/A  . Number of children: N/A  . Years of education: N/A   Occupational  History  . Not on file.   Social History Main Topics  . Smoking status: Never Smoker  . Smokeless tobacco: Never Used  . Alcohol use No  . Drug use: No  . Sexual activity: Yes   Other Topics Concern  . Not on file   Social History Narrative  . No narrative on file   No current facility-administered medications on file prior to encounter.    Current Outpatient Prescriptions on File Prior to Encounter  Medication Sig Dispense Refill  . Prenatal MV-Min-FA-Omega-3 (PRENATAL GUMMIES/DHA & FA PO) Take 1.25 tablets by mouth daily.    .Marland Kitchenaspirin EC 81 MG tablet Take 1 tablet (81 mg total) by mouth daily. (Patient not taking: Reported on 01/11/2017) 30 tablet 7  . Blood Glucose Monitoring Suppl (ACCU-CHEK NANO SMARTVIEW) w/Device KIT 1 kit by Subdermal route as directed. Check blood sugars for fasting, and two hours after breakfast, lunch and dinner (4 checks daily) 1 kit 0  . glyBURIDE (DIABETA) 2.5 MG tablet Take 1 tablet (2.5 mg total) by mouth 2 (two) times daily with a meal. (Patient not taking: Reported on 01/11/2017) 60 tablet 3  . hydrocortisone (ANUSOL-HC) 25 MG suppository Place 1 suppository (25 mg total) rectally 2 (two) times daily. (Patient not taking: Reported on 01/11/2017) 12 suppository 1  . NIFEdipine (PROCARDIA XL) 30 MG 24 hr tablet  Take 1 tablet (30 mg total) by mouth 2 (two) times daily. (Patient not taking: Reported on 01/11/2017) 60 tablet 3  . promethazine (PHENERGAN) 25 MG suppository Place 1 suppository (25 mg total) rectally every 6 (six) hours as needed for nausea or vomiting. (Patient not taking: Reported on 01/06/2017) 12 each 0   Allergies  Allergen Reactions  . Lyrica [Pregabalin] Other (See Comments)    Hair loss   . Ultracet [Tramadol-Acetaminophen] Other (See Comments)    Stomach burning   . Ultram [Tramadol] Other (See Comments)    Stomach burning     I have reviewed the past Medical Hx, Surgical Hx, Social Hx, Allergies and Medications.   REVIEW OF  SYSTEMS  A comprehensive ROS was negative except per HPI.     OBJECTIVE Patient Vitals for the past 24 hrs:  BP Temp Temp src Pulse Resp Weight  01/11/17 1812 148/91 - - 70 - -  01/11/17 1741 144/75 - - 75 - -  01/11/17 1603 166/82 98.3 F (36.8 C) Oral 68 20 268 lb 4 oz (121.7 kg)    PHYSICAL EXAM Constitutional: Well-developed, well-nourished obese female in no acute distress.  Cardiovascular: normal rate, rhythm, no murmurs Respiratory: normal rate and effort. CTAB. GI: Abd soft, generalized tenderness to epigastric and upper abdomen, non-distended. Pos BS x 4. Fundus below/near umbilicus and is non-tender. MS: Extremities nontender, no edema, normal ROM Neurologic: Alert and oriented x 4.  GU: Neg CVAT Psych: Tangential in thought, fixates.    LAB RESULTS Results for orders placed or performed during the hospital encounter of 01/11/17 (from the past 24 hour(s))  Urinalysis, Routine w reflex microscopic     Status: Abnormal   Collection Time: 01/11/17  3:52 PM  Result Value Ref Range   Color, Urine YELLOW YELLOW   APPearance CLOUDY (A) CLEAR   Specific Gravity, Urine 1.020 1.005 - 1.030   pH 5.0 5.0 - 8.0   Glucose, UA NEGATIVE NEGATIVE mg/dL   Hgb urine dipstick NEGATIVE NEGATIVE   Bilirubin Urine NEGATIVE NEGATIVE   Ketones, ur 20 (A) NEGATIVE mg/dL   Protein, ur 100 (A) NEGATIVE mg/dL   Nitrite NEGATIVE NEGATIVE   Leukocytes, UA NEGATIVE NEGATIVE   RBC / HPF 0-5 0 - 5 RBC/hpf   WBC, UA 0-5 0 - 5 WBC/hpf   Bacteria, UA RARE (A) NONE SEEN   Squamous Epithelial / LPF 6-30 (A) NONE SEEN   Mucous PRESENT   Comprehensive metabolic panel     Status: Abnormal   Collection Time: 01/11/17  5:43 PM  Result Value Ref Range   Sodium 133 (L) 135 - 145 mmol/L   Potassium 4.2 3.5 - 5.1 mmol/L   Chloride 103 101 - 111 mmol/L   CO2 21 (L) 22 - 32 mmol/L   Glucose, Bld 152 (H) 65 - 99 mg/dL   BUN 9 6 - 20 mg/dL   Creatinine, Ser 0.52 0.44 - 1.00 mg/dL   Calcium 9.1 8.9 -  10.3 mg/dL   Total Protein 7.2 6.5 - 8.1 g/dL   Albumin 3.8 3.5 - 5.0 g/dL   AST 13 (L) 15 - 41 U/L   ALT 14 14 - 54 U/L   Alkaline Phosphatase 38 38 - 126 U/L   Total Bilirubin 0.6 0.3 - 1.2 mg/dL   GFR calc non Af Amer >60 >60 mL/min   GFR calc Af Amer >60 >60 mL/min   Anion gap 9 5 - 15  Lipase, blood     Status:  None   Collection Time: 01/11/17  5:43 PM  Result Value Ref Range   Lipase 21 11 - 51 U/L  CBC with Differential/Platelet     Status: Abnormal   Collection Time: 01/11/17  5:43 PM  Result Value Ref Range   WBC 9.5 4.0 - 10.5 K/uL   RBC 3.90 3.87 - 5.11 MIL/uL   Hemoglobin 11.5 (L) 12.0 - 15.0 g/dL   HCT 32.7 (L) 36.0 - 46.0 %   MCV 83.8 78.0 - 100.0 fL   MCH 29.5 26.0 - 34.0 pg   MCHC 35.2 30.0 - 36.0 g/dL   RDW 13.6 11.5 - 15.5 %   Platelets 277 150 - 400 K/uL   Neutrophils Relative % 68 %   Neutro Abs 6.6 1.7 - 7.7 K/uL   Lymphocytes Relative 28 %   Lymphs Abs 2.6 0.7 - 4.0 K/uL   Monocytes Relative 3 %   Monocytes Absolute 0.3 0.1 - 1.0 K/uL   Eosinophils Relative 1 %   Eosinophils Absolute 0.1 0.0 - 0.7 K/uL   Basophils Relative 0 %   Basophils Absolute 0.0 0.0 - 0.1 K/uL  Glucose, capillary     Status: Abnormal   Collection Time: 01/11/17  5:47 PM  Result Value Ref Range   Glucose-Capillary 142 (H) 65 - 99 mg/dL    IMAGING [place] Mfm Ob Transvaginal  Result Date: 12/30/2016 ----------------------------------------------------------------------  OBSTETRICS REPORT                      (Signed Final 12/30/2016 12:34 pm) ---------------------------------------------------------------------- Patient Info  ID #:       [id number]                         D.O.B.:   [birth year] (40 yrs)  Name:       Ladon Applebaum                 Visit Date:  12/30/2016 08:51 am ---------------------------------------------------------------------- Performed By  Performed By:     Rodrigo Ran BS      Ref. Address:     [address]                    [place]                                                              [place], [place]  Attending:        Renella Cunas MD       [facility]  Referred By:      Guss Bunde MD ---------------------------------------------------------------------- Orders   #  Description                                 Code   1  [place] MFM OB DETAIL +[address]                     76811.01   2  [place] MFM OB TRANSVAGINAL                      55732.2  ----------------------------------------------------------------------   #  Ordered By               Order #        Accession #    Episode #   1  Silas [place]            [id number]      [id number]     [id number]   2  KELLY LEGGETT            [id number]      [id number]     [id number]  ---------------------------------------------------------------------- Indications   [redacted] weeks gestation of pregnancy                Z3A.18   Hypertension - Chronic/Pre-existing            O10.019   (Procardia)   Diabetes - Pregestational, 2nd trimester       O24.312   (glyburide) (A1c 11/27 7.0)   Advanced maternal age primigravida 67+,        O54.512   second trimester; declined testing   Obesity complicating pregnancy, second         O99.212   trimester  ---------------------------------------------------------------------- OB History  Blood Type:            Height:  5'10"  Weight (lb):  261      BMI:   37.45  Gravidity:    1         Term:   0        Prem:   0        SAB:   0  TOP:          0       Ectopic:  0 ---------------------------------------------------------------------- Fetal Evaluation  Num Of Fetuses:     1  Cardiac Activity:   Observed  Presentation:       Cephalic  Placenta:           Posterior Previa  P. Cord Insertion:  Visualized, central  Amniotic Fluid  AFI FV:      Subjectively within normal limits                              Largest Pocket(cm)                               7.18 ---------------------------------------------------------------------- Biometry  BPD:      41.6  mm     G. Age:  18w 4d         76  %    CI:        69.34   %  70 - 86                                                          FL/HC:      17.1   %   15.8 - 18  HC:      159.5  mm     G. Age:  18w 6d         79  %    HC/AC:      1.12       1.07 - 1.29  AC:      141.9  mm     G. Age:  19w 4d         90  %    FL/BPD:     65.4   %  FL:       27.2  mm     G. Age:  18w 2d         56  %    FL/AC:      19.2   %   20 - 24  HUM:        27  mm     G. Age:  18w 4d         73  %  Est. FW:     265  gm      0 lb 9 oz     64  % ---------------------------------------------------------------------- Gestational Age  U/S Today:     18w 6d                                        EDD:   05/27/17  Best:          Renae Fickle 0d    Det. By:   Previous Ultrasound      EDD:   06/02/17                                      (10/07/16) ---------------------------------------------------------------------- Anatomy  Cranium:               Appears normal         Aortic Arch:            Appears normal  Cavum:                 Appears normal         Ductal Arch:            Not well visualized  Ventricles:            Appears normal         Diaphragm:              Appears normal  Choroid Plexus:        Appears normal         Stomach:                Appears normal, left  sided  Cerebellum:            Appears normal         Abdomen:                Appears normal  Posterior Fossa:       Appears normal         Abdominal Wall:         Appears nml (cord                                                                        insert, abd wall)  Nuchal Fold:           Appears normal         Cord Vessels:           Appears normal (3                                                                        vessel cord)  Face:                  Appears normal         Kidneys:                Appear  normal                         (orbits and profile)  Lips:                  Appears normal         Bladder:                Appears normal  Thoracic:              Appears normal         Spine:                  Appears normal  Heart:                 Appears normal         Upper Extremities:      Appears normal                         (4CH, axis, and                         situs)  RVOT:                  Appears normal         Lower Extremities:      Appears normal  LVOT:                  Appears normal  Other:  Fetus appears to be a female. Heels and 5th digit visualized. Nasal          bone visualized.  Open hands visualized. ---------------------------------------------------------------------- Cervix Uterus Adnexa  Cervix  Length:              5  cm.  Normal appearance by transvaginal scan  Uterus  No abnormality visualized.  Left Ovary  Within normal limits.  Right Ovary  Within normal limits.  Cul De Sac:   No free fluid seen.  Adnexa:       No abnormality visualized. ---------------------------------------------------------------------- Impression  SIUP at 18+0 weeks  Normal detailed fetal anatomy; limited views of the DA  Markers of aneuploidy: none  Normal amniotic fluid volume  Measurements consistent with prior US  EV views of cervix: normal length without funneling; posterior  previa - ? with contraction  Later in study, TA views showed no previa ---------------------------------------------------------------------- Recommendations  Follow-up ultrasound for growth and to reassess placental  location in 4 weeks ----------------------------------------------------------------------                 Renella Cunas, MD Electronically Signed Final Report   12/30/2016 12:34 pm ----------------------------------------------------------------------  [place] Mfm Ob Detail +14 Wk  Result Date: 12/30/2016 ----------------------------------------------------------------------  OBSTETRICS REPORT                      (Signed  Final 12/30/2016 12:34 pm) ---------------------------------------------------------------------- Patient Info  ID #:       [id number]                         D.O.B.:   May 01, 1976 (40 yrs)  Name:       Ladon Applebaum                 Visit Date:  12/30/2016 08:51 am ---------------------------------------------------------------------- Performed By  Performed By:     Rodrigo Ran BS      Ref. Address:     [address]                    [place] [facility]                                                             [place], [place]  Attending:        Renella Cunas MD       Location:         [facility]  Referred By:      Guss Bunde MD ---------------------------------------------------------------------- Orders   #  Description  Code   1  [place] MFM OB DETAIL +14 WK                     [id number]   2  [place] MFM OB TRANSVAGINAL                      16109.6  ----------------------------------------------------------------------   #  Ordered By               Order #        Accession #    Episode #   1  Silas [place]            [id number]      [id number]     [id number]   2  KELLY LEGGETT            [id number]      [id number]     [id number]  ---------------------------------------------------------------------- Indications   [redacted] weeks gestation of pregnancy                Z3A.18   Hypertension - Chronic/Pre-existing            O10.019   (Procardia)   Diabetes - Pregestational, 2nd trimester       O24.312   (glyburide) (A1c 11/27 7.0)   Advanced maternal age primigravida 76+,        O62.512   second trimester; declined testing   Obesity complicating pregnancy, second         O99.212   trimester  ---------------------------------------------------------------------- OB History  Blood Type:            Height:  5'10"  Weight (lb):  261      BMI:   37.45   Gravidity:    1         Term:   0        Prem:   0        SAB:   0  TOP:          0       Ectopic:  0 ---------------------------------------------------------------------- Fetal Evaluation  Num Of Fetuses:     1  Cardiac Activity:   Observed  Presentation:       Cephalic  Placenta:           Posterior Previa  P. Cord Insertion:  Visualized, central  Amniotic Fluid  AFI FV:      Subjectively within normal limits                              Largest Pocket(cm)                              7.18 ---------------------------------------------------------------------- Biometry  BPD:      41.6  mm     G. Age:  18w 4d         76  %    CI:        69.34   %   70 - 86                                                          FL/HC:      17.1   %  15.8 - 18  HC:      159.5  mm     G. Age:  18w 6d         79  %    HC/AC:      1.12       1.07 - 1.29  AC:      141.9  mm     G. Age:  19w 4d         90  %    FL/BPD:     65.4   %  FL:       27.2  mm     G. Age:  18w 2d         56  %    FL/AC:      19.2   %   20 - 24  HUM:        27  mm     G. Age:  18w 4d         73  %  Est. FW:     265  gm      0 lb 9 oz     64  % ---------------------------------------------------------------------- Gestational Age  U/S Today:     18w 6d                                        EDD:   05/27/17  Best:          Renae Fickle 0d    Det. By:   Previous Ultrasound      EDD:   06/02/17                                      (10/07/16) ---------------------------------------------------------------------- Anatomy  Cranium:               Appears normal         Aortic Arch:            Appears normal  Cavum:                 Appears normal         Ductal Arch:            Not well visualized  Ventricles:            Appears normal         Diaphragm:              Appears normal  Choroid Plexus:        Appears normal         Stomach:                Appears normal, left                                                                        sided  Cerebellum:            Appears  normal         Abdomen:  Appears normal  Posterior Fossa:       Appears normal         Abdominal Wall:         Appears nml (cord                                                                        insert, abd wall)  Nuchal Fold:           Appears normal         Cord Vessels:           Appears normal (3                                                                        vessel cord)  Face:                  Appears normal         Kidneys:                Appear normal                         (orbits and profile)  Lips:                  Appears normal         Bladder:                Appears normal  Thoracic:              Appears normal         Spine:                  Appears normal  Heart:                 Appears normal         Upper Extremities:      Appears normal                         (4CH, axis, and                         situs)  RVOT:                  Appears normal         Lower Extremities:      Appears normal  LVOT:                  Appears normal  Other:  Fetus appears to be a female. Heels and 5th digit visualized. Nasal          bone visualized. Open hands visualized. ---------------------------------------------------------------------- Cervix Uterus Adnexa  Cervix  Length:              5  cm.  Normal appearance by transvaginal scan  Uterus  No abnormality visualized.  Left  Ovary  Within normal limits.  Right Ovary  Within normal limits.  Cul De Sac:   No free fluid seen.  Adnexa:       No abnormality visualized. ---------------------------------------------------------------------- Impression  SIUP at 18+0 weeks  Normal detailed fetal anatomy; limited views of the DA  Markers of aneuploidy: none  Normal amniotic fluid volume  Measurements consistent with prior US  EV views of cervix: normal length without funneling; posterior  previa - ? with contraction  Later in study, TA views showed no previa ---------------------------------------------------------------------- Recommendations   Follow-up ultrasound for growth and to reassess placental  location in 4 weeks ----------------------------------------------------------------------                 Renella Cunas, MD Electronically Signed Final Report   12/30/2016 12:34 pm ----------------------------------------------------------------------   MAU COURSE GI cocktail - resolved symptoms, patient feels hungry and wants to go CMP - normal CBC - mildly anemic but otherwise normal Lipase - WNL CBG- slightly elevated in 140s, patient not taking medication VSS, elevated BP- has CHTN, not taking meds, initial SBP 160s, repeats in 140s.   MDM Plan of care reviewed with patient, including labs and tests ordered and medical treatment. Has not been taking any of her medications for her chronic hypertension or diabetes. Encouraged patient to pick up her medications and that reviewed by medications or's important to take during the pregnancy for better control of her blood pressure and her diabetes (ie. Preeclampsia, growth and health of baby, etc.). In regards to patient's complaint for coming in with her abdominal pain, she states after her GI cocktail her pain went completely away and she now has "hunger pains" and she states she would like to leave so she go home and eat. Reviewed symptoms that are important to return, especially lower uterine cramping or contractions, vaginal bleeding, or worsening symptoms of her abdominal pain. Patient has known posterior previa, reviewed with this means including demonstration with drawings, patient states she has a more clear understanding of this now, and states she has been exercising pelvic rest.   ASSESSMENT 1. Heartburn during pregnancy in second trimester   2. Placenta previa antepartum   3. Supervision of high risk pregnancy in second trimester   4. Pre-existing essential hypertension during pregnancy in second trimester   5. Pre-existing type 2 diabetes mellitus during pregnancy in second  trimester     PLAN Discharge home in stable condition. Encouraged to pick up prescriptions for blood pressure, diabetes, and for heartburn. Warning signs/symptoms to return given F/U with OB provider Preeclampsia sign/symptoms reviewed with patient for future part of pregnancy.  Follow-up [facility]. Schedule an appointment as soon as possible for a visit in 1 week(s).   Specialty:  Obstetrics and Gynecology Why:  Follow up OB appointment Contact information: [address] [place] [phone]         Allergies as of 01/11/2017      Reactions   Lyrica [pregabalin] Other (See Comments)   Hair loss   Ultracet [tramadol-acetaminophen] Other (See Comments)   Stomach burning   Ultram [tramadol] Other (See Comments)   Stomach burning      Medication List    TAKE these medications   ACCU-CHEK NANO SMARTVIEW w/Device Kit 1 kit by Subdermal route as directed. Check blood sugars for fasting, and two hours after breakfast, lunch and dinner (4 checks daily)   aspirin EC 81 MG tablet Take 1 tablet (81  mg total) by mouth daily.   glyBURIDE 2.5 MG tablet Commonly known as:  DIABETA Take 1 tablet (2.5 mg total) by mouth 2 (two) times daily with a meal.   hydrocortisone 25 MG suppository Commonly known as:  ANUSOL-HC Place 1 suppository (25 mg total) rectally 2 (two) times daily.   NIFEdipine 30 MG 24 hr tablet Commonly known as:  PROCARDIA XL Take 1 tablet (30 mg total) by mouth 2 (two) times daily.   PRENATAL GUMMIES/DHA & FA PO Take 1.25 tablets by mouth daily.   promethazine 25 MG suppository Commonly known as:  PHENERGAN Place 1 suppository (25 mg total) rectally every 6 (six) hours as needed for nausea or vomiting.   ranitidine 150 MG tablet Commonly known as:  ZANTAC Take 1 tablet (150 mg total) by mouth 2 (two) times daily as needed for heartburn (Stomach pain).        Katherine Basset,  DO OB Fellow 01/11/2017 6:36 PM

## 2017-01-11 NOTE — Discharge Instructions (Signed)
Gastroesophageal Reflux Disease, Adult Introduction Normally, food travels down the esophagus and stays in the stomach to be digested. If a person has gastroesophageal reflux disease (GERD), food and stomach acid move back up into the esophagus. When this happens, the esophagus becomes sore and swollen (inflamed). Over time, GERD can make small holes (ulcers) in the lining of the esophagus. Follow these instructions at home: Diet  Follow a diet as told by your doctor. You may need to avoid foods and drinks such as:  Coffee and tea (with or without caffeine).  Drinks that contain alcohol.  Energy drinks and sports drinks.  Carbonated drinks or sodas.  Chocolate and cocoa.  Peppermint and mint flavorings.  Garlic and onions.  Horseradish.  Spicy and acidic foods, such as peppers, chili powder, curry powder, vinegar, hot sauces, and BBQ sauce.  Citrus fruit juices and citrus fruits, such as oranges, lemons, and limes.  Tomato-based foods, such as red sauce, chili, salsa, and pizza with red sauce.  Fried and fatty foods, such as donuts, french fries, potato chips, and high-fat dressings.  High-fat meats, such as hot dogs, rib eye steak, sausage, ham, and bacon.  High-fat dairy items, such as whole milk, butter, and cream cheese.  Eat small meals often. Avoid eating large meals.  Avoid drinking large amounts of liquid with your meals.  Avoid eating meals during the 2-3 hours before bedtime.  Avoid lying down right after you eat.  Do not exercise right after you eat. General instructions  Pay attention to any changes in your symptoms.  Take over-the-counter and prescription medicines only as told by your doctor. Do not take aspirin, ibuprofen, or other NSAIDs unless your doctor says it is okay.  Do not use any tobacco products, including cigarettes, chewing tobacco, and e-cigarettes. If you need help quitting, ask your doctor.  Wear loose clothes. Do not wear anything  tight around your waist.  Raise (elevate) the head of your bed about 6 inches (15 cm).  Try to lower your stress. If you need help doing this, ask your doctor.  If you are overweight, lose an amount of weight that is healthy for you. Ask your doctor about a safe weight loss goal.  Keep all follow-up visits as told by your doctor. This is important. Contact a doctor if:  You have new symptoms.  You lose weight and you do not know why it is happening.  You have trouble swallowing, or it hurts to swallow.  You have wheezing or a cough that keeps happening.  Your symptoms do not get better with treatment.  You have a hoarse voice. Get help right away if:  You have pain in your arms, neck, jaw, teeth, or back.  You feel sweaty, dizzy, or light-headed.  You have chest pain or shortness of breath.  You throw up (vomit) and your throw up looks like blood or coffee grounds.  You pass out (faint).  Your poop (stool) is bloody or black.  You cannot swallow, drink, or eat. This information is not intended to replace advice given to you by your health care provider. Make sure you discuss any questions you have with your health care provider. Document Released: 05/24/2008 Document Revised: 05/13/2016 Document Reviewed: 04/02/2015  2017 Elsevier     Hypertension During Pregnancy Hypertension, commonly called high blood pressure, is when the force of blood pumping through your arteries is too strong. Arteries are blood vessels that carry blood from the heart throughout the body. Hypertension during  pregnancy can cause problems for you and your baby. Your baby may be born early (prematurely) or may not weigh as much as he or she should at birth. Very bad cases of hypertension during pregnancy can be life-threatening. Different types of hypertension can occur during pregnancy. These include:  Chronic hypertension. This happens when:  You have hypertension before pregnancy and it  continues during pregnancy.  You develop hypertension before you are [redacted] weeks pregnant, and it continues during pregnancy.  Gestational hypertension. This is hypertension that develops after the 20th week of pregnancy.  Preeclampsia, also called toxemia of pregnancy. This is a very serious type of hypertension that develops only during pregnancy. It affects the whole body, and it can be very dangerous for you and your baby. Gestational hypertension and preeclampsia usually go away within 6 weeks after your baby is born. Women who have hypertension during pregnancy have a greater chance of developing hypertension later in life or during future pregnancies. What are the causes? The exact cause of hypertension is not known. What increases the risk? There are certain factors that make it more likely for you to develop hypertension during pregnancy. These include:  Having hypertension during a previous pregnancy or prior to pregnancy.  Being overweight.  Being older than age 13.  Being pregnant for the first time or being pregnant with more than one baby.  Becoming pregnant using fertilization methods such as IVF (in vitro fertilization).  Having diabetes, kidney problems, or systemic lupus erythematosus.  Having a family history of hypertension. What are the signs or symptoms? Chronic hypertension and gestational hypertension rarely cause symptoms. Preeclampsia causes symptoms, which may include:  Increased protein in your urine. Your health care provider will check for this at every visit before you give birth (prenatal visit).  Severe headaches.  Sudden weight gain.  Swelling of the hands, face, legs, and feet.  Nausea and vomiting.  Vision problems, such as blurred or double vision.  Numbness in the face, arms, legs, and feet.  Dizziness.  Slurred speech.  Sensitivity to bright lights.  Abdominal pain.  Convulsions. How is this diagnosed? You may be diagnosed with  hypertension during a routine prenatal exam. At each prenatal visit, you may:  Have a urine test to check for high amounts of protein in your urine.  Have your blood pressure checked. A blood pressure reading is recorded as two numbers, such as "120 over 80" (or 120/80). The first ("top") number is called the systolic pressure. It is a measure of the pressure in your arteries when your heart beats. The second ("bottom") number is called the diastolic pressure. It is a measure of the pressure in your arteries as your heart relaxes between beats. Blood pressure is measured in a unit called mm Hg. A normal blood pressure reading is:  Systolic: below 120.  Diastolic: below 80. The type of hypertension that you are diagnosed with depends on your test results and when your symptoms developed.  Chronic hypertension is usually diagnosed before 20 weeks of pregnancy.  Gestational hypertension is usually diagnosed after 20 weeks of pregnancy.  Hypertension with high amounts of protein in the urine is diagnosed as preeclampsia.  Blood pressure measurements that stay above 160 systolic, or above 110 diastolic, are signs of severe preeclampsia. How is this treated? Treatment for hypertension during pregnancy varies depending on the type of hypertension you have and how serious it is.  If you take medicines called ACE inhibitors to treat chronic hypertension, you  may need to switch medicines. ACE inhibitors should not be taken during pregnancy.  If you have gestational hypertension, you may need to take blood pressure medicine.  If you are at risk for preeclampsia, your health care provider may recommend that you take a low-dose aspirin every day to prevent high blood pressure during your pregnancy.  If you have severe preeclampsia, you may need to be hospitalized so you and your baby can be monitored closely. You may also need to take medicine (magnesium sulfate) to prevent seizures and to lower blood  pressure. This medicine may be given as an injection or through an IV tube.  In some cases, if your condition gets worse, you may need to deliver your baby early. Follow these instructions at home: Eating and drinking  Drink enough fluid to keep your urine clear or pale yellow.  Eat a healthy diet that is low in salt (sodium). Do not add salt to your food. Check food labels to see how much sodium a food or beverage contains. Lifestyle  Do not use any products that contain nicotine or tobacco, such as cigarettes and e-cigarettes. If you need help quitting, ask your health care provider.  Do not use alcohol.  Avoid caffeine.  Avoid stress as much as possible. Rest and get plenty of sleep. General instructions  Take over-the-counter and prescription medicines only as told by your health care provider.  While lying down, lie on your left side. This keeps pressure off your baby.  While sitting or lying down, raise (elevate) your feet. Try putting some pillows under your lower legs.  Exercise regularly. Ask your health care provider what kinds of exercise are best for you.  Keep all prenatal and follow-up visits as told by your health care provider. This is important. Contact a health care provider if:  You have symptoms that your health care provider told you may require more treatment or monitoring, such as:  Fever.  Vomiting.  Headache. Get help right away if:  You have severe abdominal pain or vomiting that does not get better with treatment.  You suddenly develop swelling in your hands, ankles, or face.  You gain 4 lbs (1.8 kg) or more in 1 week.  You develop vaginal bleeding, or you have blood in your urine.  You do not feel your baby moving as much as usual.  You have blurred or double vision.  You have muscle twitching or sudden tightening (spasms).  You have shortness of breath.  Your lips or fingernails turn blue. This information is not intended to  replace advice given to you by your health care provider. Make sure you discuss any questions you have with your health care provider. Document Released: 08/24/2011 Document Revised: 06/25/2016 Document Reviewed: 05/21/2016 Elsevier Interactive Patient Education  2017 Else[employer] Inc[employer]  Type 1 or Type 2 Diabetes Mellitus During Pregnancy, Diagnosis Type 1 diabetes (type 1 diabetes mellitus) and type 2 diabetes (type 2 diabetes mellitus) are long-term (chronic) diseases. Your diabetes may be caused by one or both of these problems:  Your body does not make enough of a hormone called insulin.  Your body does not respond in a normal way to insulin that it makes. Insulin lets sugars (glucose) go into cells in the body. This gives you energy. If you have diabetes, sugars cannot get into cells. This causes high blood sugar (hyperglycemia). If diabetes is treated, it may not hurt you or your baby. Your doctor will set treatment goals for you. In  general, you should have these blood sugar levels:  After not eating for a long time (fasting): 95 mg/dL (5.3 mmol/L).  After meals (postprandial):  One hour after a meal: at or below 140 mg/dL (7.8 mmol/L).  Two hours after a meal: at or below 120 mg/dL (6.7 mmol/L).  A1c (hemoglobin A1c) level: 6-6.5%. Follow these instructions at home: Questions to Ask Your Doctor  You may want to ask these questions:  Do I need to meet with a diabetes educator?  Where can I find a support group for people with diabetes?  What equipment will I need to care for myself at home?  What diabetes medicines do I need? When should I take them?  How often do I need to check my blood sugar?  What number can I call if I have questions?  When is my next doctor's visit? General instructions  Take over-the-counter and prescription medicines only as told by your doctor.  Stay at a healthy weight during pregnancy.  Keep all follow-up visits as told by your doctor. This  is important. Contact a doctor if:  Your blood sugar is at or above 240 mg/dL ([phone] mmol/L).  Your blood sugar is at or above 200 mg/dL ([phone] mmol/L), and you have ketones in your pee (urine).  You have been sick or have had a fever for 2 days or more, and you are not getting better.  You have any of these problems for more than 6 hours:  You cannot eat or drink.  You feel sick to your stomach (nauseous).  You throw up (vomit).  You have watery poop (diarrhea). Get help right away if:  Your blood sugar is lower than 54 mg/dL (3 mmol/L).  You get confused.  You have trouble:  Thinking clearly.  Breathing.  Your baby moves less than normal.  You have:  Moderate or large ketone levels in your pee (urine).  Bleeding from your vagina.  Unusual fluid coming from your vagina.  Early contractions. These may feel like tightness in your belly. This information is not intended to replace advice given to you by your health care provider. Make sure you discuss any questions you have with your health care provider. Document Released: 03/29/2016 Document Revised: 05/13/2016 Document Reviewed: 01/09/2016 Elsevier Interactive Patient Education  2017 [employer]

## 2017-01-11 NOTE — Telephone Encounter (Signed)
Called patient and informed her of appt. Patient verbalized understanding and asked if the appt & address information be mailed to her as she can not write something down right now. Told patient that I would. Patient had no questions

## 2017-01-27 ENCOUNTER — Ambulatory Visit (HOSPITAL_COMMUNITY)
Admission: RE | Admit: 2017-01-27 | Discharge: 2017-01-27 | Disposition: A | Discharge status: Home / Self Care | Payer: Self-pay | Source: Ambulatory Visit | Attending: Obstetrics & Gynecology | Admitting: Obstetrics & Gynecology

## 2017-01-27 ENCOUNTER — Other Ambulatory Visit (HOSPITAL_COMMUNITY): Payer: Self-pay | Admitting: *Deleted

## 2017-01-27 ENCOUNTER — Other Ambulatory Visit (HOSPITAL_COMMUNITY): Payer: Self-pay | Admitting: Maternal and Fetal Medicine

## 2017-01-27 ENCOUNTER — Encounter (HOSPITAL_COMMUNITY): Payer: Self-pay

## 2017-01-27 ENCOUNTER — Ambulatory Visit (INDEPENDENT_AMBULATORY_CARE_PROVIDER_SITE_OTHER): Payer: Self-pay | Admitting: Obstetrics and Gynecology

## 2017-01-27 VITALS — BP 156/83 | HR 81 | Wt 271.0 lb

## 2017-01-27 DIAGNOSIS — O4402 Placenta previa specified as without hemorrhage, second trimester: Secondary | ICD-10-CM

## 2017-01-27 DIAGNOSIS — Z113 Encounter for screening for infections with a predominantly sexual mode of transmission: Secondary | ICD-10-CM

## 2017-01-27 DIAGNOSIS — O10919 Unspecified pre-existing hypertension complicating pregnancy, unspecified trimester: Secondary | ICD-10-CM

## 2017-01-27 DIAGNOSIS — O4400 Placenta previa specified as without hemorrhage, unspecified trimester: Secondary | ICD-10-CM

## 2017-01-27 DIAGNOSIS — O24112 Pre-existing diabetes mellitus, type 2, in pregnancy, second trimester: Secondary | ICD-10-CM

## 2017-01-27 DIAGNOSIS — O10012 Pre-existing essential hypertension complicating pregnancy, second trimester: Secondary | ICD-10-CM

## 2017-01-27 DIAGNOSIS — N898 Other specified noninflammatory disorders of vagina: Secondary | ICD-10-CM

## 2017-01-27 DIAGNOSIS — O09512 Supervision of elderly primigravida, second trimester: Secondary | ICD-10-CM | POA: Insufficient documentation

## 2017-01-27 DIAGNOSIS — Z3A22 22 weeks gestation of pregnancy: Secondary | ICD-10-CM | POA: Insufficient documentation

## 2017-01-27 DIAGNOSIS — O09522 Supervision of elderly multigravida, second trimester: Secondary | ICD-10-CM

## 2017-01-27 DIAGNOSIS — O0992 Supervision of high risk pregnancy, unspecified, second trimester: Secondary | ICD-10-CM

## 2017-01-27 DIAGNOSIS — E139 Other specified diabetes mellitus without complications: Secondary | ICD-10-CM

## 2017-01-27 DIAGNOSIS — O09521 Supervision of elderly multigravida, first trimester: Secondary | ICD-10-CM

## 2017-01-27 LAB — POCT URINALYSIS DIP (DEVICE)
GLUCOSE, UA: NEGATIVE mg/dL
Hgb urine dipstick: NEGATIVE
Leukocytes, UA: NEGATIVE
Nitrite: NEGATIVE
PH: 6 (ref 5.0–8.0)
Protein, ur: NEGATIVE mg/dL
Specific Gravity, Urine: 1.025 (ref 1.005–1.030)
Urobilinogen, UA: 0.2 mg/dL (ref 0.0–1.0)

## 2017-01-27 NOTE — Progress Notes (Signed)
   PRENATAL VISIT NOTE  Subjective:  Latoya Strickland is a 41 y.o. G1P0000 at 6011w0d being seen today for ongoing prenatal care.  She is currently monitored for the following issues for this high-risk pregnancy and has Diabetes (HCC); Essential hypertension; DJD (degenerative joint disease); Supervision of high-risk pregnancy; Hypertension in pregnancy; Diabetes in pregnancy; AMA (advanced maternal age) multigravida 35+, first trimester; Marijuana use; Hemorrhoids during pregnancy; and Placenta previa antepartum on her problem list.  Patient reports no complaints.  Contractions: Not present. Vag. Bleeding: None.  Movement: Present. Denies leaking of fluid.   The following portions of the patient's history were reviewed and updated as appropriate: allergies, current medications, past family history, past medical history, past social history, past surgical history and problem list. Problem list updated.  Objective:   Vitals:   01/27/17 1526 01/27/17 1536  BP: (!) 155/90 (!) 156/83  Pulse: 81   Weight: 271 lb (122.9 kg)     Fetal Status: Fetal Heart Rate (bpm): 158   Movement: Present     General:  Alert, oriented and cooperative. Patient is in no acute distress.  Skin: Skin is warm and dry. No rash noted.   Cardiovascular: Normal heart rate noted  Respiratory: Normal respiratory effort, no problems with respiration noted  Abdomen: Soft, gravid, appropriate for gestational age. Pain/Pressure: Absent     Pelvic:  Cervical exam deferred        Extremities: Normal range of motion.  Edema: None  Mental Status: Normal mood and affect. Normal behavior. Normal judgment and thought content.   Assessment and Plan:  Pregnancy: G1P0000 at 2511w0d  1. Supervision of high risk pregnancy in second trimester Patient is doing well  2. AMA (advanced maternal age) multigravida 35+, first trimester Declined testing  3. Pre-existing type 2 diabetes mellitus during pregnancy in second trimester Patient is  not checking CBGs and not consistently taking glyburide Follow up growth ultrasound scheduled Fetal echo schedule in March RTC in 2 weeks for CBG check  4. Pre-existing essential hypertension during pregnancy in second trimester Patient is not taking procardia daily Long discussion with patient regarding non compliance and potential fetal/maternal complications  5. Diabetes mellitus of other type without complication, unspecified long term insulin use status (HCC)   6. Placenta previa antepartum Resolved on today's scan EFW 67%tile with AC 90%tile Follow up ultrasound in March  Preterm labor symptoms and general obstetric precautions including but not limited to vaginal bleeding, contractions, leaking of fluid and fetal movement were reviewed in detail with the patient. Please refer to After Visit Summary for other counseling recommendations.  Return in about 2 weeks (around 02/10/2017).   Catalina AntiguaPeggy Willis Kuipers, MD

## 2017-01-27 NOTE — Progress Notes (Signed)
Patient reports pain in right hand & vaginal itching  Patient reports sometimes not taking pills as she does not like taking pills and has a hard time swallowing them. Patient states she usually throws up the glyburide pill almost every day

## 2017-01-28 LAB — CERVICOVAGINAL ANCILLARY ONLY
Bacterial vaginitis: NEGATIVE
CANDIDA VAGINITIS: POSITIVE — AB
TRICH (WINDOWPATH): NEGATIVE

## 2017-01-31 ENCOUNTER — Other Ambulatory Visit: Payer: Self-pay | Admitting: Obstetrics and Gynecology

## 2017-01-31 MED ORDER — FLUCONAZOLE 150 MG PO TABS: 150.0000 mg | ORAL_TABLET | Freq: Once | ORAL | 0 refills | Status: AC

## 2017-02-01 ENCOUNTER — Other Ambulatory Visit: Payer: Self-pay

## 2017-02-01 MED ORDER — FLUCONAZOLE 150 MG PO TABS: 150.0000 mg | ORAL_TABLET | Freq: Once | ORAL | 0 refills | Status: AC

## 2017-02-01 NOTE — Telephone Encounter (Signed)
Medication has been called into the pharmacy for patient.

## 2017-02-07 ENCOUNTER — Encounter (HOSPITAL_COMMUNITY): Payer: Self-pay | Admitting: Emergency Medicine

## 2017-02-07 ENCOUNTER — Ambulatory Visit (HOSPITAL_COMMUNITY)
Admission: EM | Admit: 2017-02-07 | Discharge: 2017-02-07 | Disposition: A | Discharge status: Home / Self Care | Payer: Self-pay | Attending: Internal Medicine | Admitting: Internal Medicine

## 2017-02-07 DIAGNOSIS — S63501A Unspecified sprain of right wrist, initial encounter: Secondary | ICD-10-CM

## 2017-02-07 MED ORDER — NAPROXEN 500 MG PO TABS: 500.0000 mg | ORAL_TABLET | Freq: Two times a day (BID) | ORAL | 0 refills | Status: DC
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
## 2017-02-10 ENCOUNTER — Ambulatory Visit (INDEPENDENT_AMBULATORY_CARE_PROVIDER_SITE_OTHER): Payer: Self-pay | Admitting: Obstetrics and Gynecology

## 2017-02-10 VITALS — BP 145/90 | HR 64 | Wt 268.9 lb

## 2017-02-10 DIAGNOSIS — O10012 Pre-existing essential hypertension complicating pregnancy, second trimester: Secondary | ICD-10-CM

## 2017-02-10 DIAGNOSIS — O24112 Pre-existing diabetes mellitus, type 2, in pregnancy, second trimester: Secondary | ICD-10-CM

## 2017-02-10 DIAGNOSIS — O09521 Supervision of elderly multigravida, first trimester: Secondary | ICD-10-CM

## 2017-02-10 DIAGNOSIS — O09522 Supervision of elderly multigravida, second trimester: Secondary | ICD-10-CM

## 2017-02-10 DIAGNOSIS — O0992 Supervision of high risk pregnancy, unspecified, second trimester: Secondary | ICD-10-CM

## 2017-02-10 LAB — POCT URINALYSIS DIP (DEVICE)
BILIRUBIN URINE: NEGATIVE
Glucose, UA: NEGATIVE mg/dL
Ketones, ur: NEGATIVE mg/dL
LEUKOCYTES UA: NEGATIVE
NITRITE: NEGATIVE
Protein, ur: NEGATIVE mg/dL
Specific Gravity, Urine: 1.02 (ref 1.005–1.030)
Urobilinogen, UA: 0.2 mg/dL (ref 0.0–1.0)
pH: 6 (ref 5.0–8.0)

## 2017-02-10 NOTE — Progress Notes (Signed)
   PRENATAL VISIT NOTE  Subjective:  Latoya Strickland is a 41 y.o. G1P0000 at 3356w0d being seen today for ongoing prenatal care.  She is currently monitored for the following issues for this high-risk pregnancy and has Diabetes (HCC); Essential hypertension; DJD (degenerative joint disease); Supervision of high-risk pregnancy; Hypertension in pregnancy; Diabetes in pregnancy; AMA (advanced maternal age) multigravida 35+, first trimester; Marijuana use; Hemorrhoids during pregnancy; and Placenta previa antepartum on her problem list.  Patient reports no complaints.  Contractions: Not present. Vag. Bleeding: None.  Movement: Present. Denies leaking of fluid.   The following portions of the patient's history were reviewed and updated as appropriate: allergies, current medications, past family history, past medical history, past social history, past surgical history and problem list. Problem list updated.  Objective:   Vitals:   02/10/17 0816  BP: (!) 145/90  Pulse: 64  Weight: 268 lb 14.4 oz (122 kg)    Fetal Status: Fetal Heart Rate (bpm): 155 Fundal Height: 28 cm Movement: Present     General:  Alert, oriented and cooperative. Patient is in no acute distress.  Skin: Skin is warm and dry. No rash noted.   Cardiovascular: Normal heart rate noted  Respiratory: Normal respiratory effort, no problems with respiration noted  Abdomen: Soft, gravid, appropriate for gestational age. Pain/Pressure: Absent     Pelvic:  Cervical exam deferred        Extremities: Normal range of motion.  Edema: None  Mental Status: Normal mood and affect. Normal behavior. Normal judgment and thought content.   Assessment and Plan:  Pregnancy: G1P0000 at 456w0d  1. Supervision of high risk pregnancy in second trimester Patient is without complaints Continues to be non compliant with medications  2. AMA (advanced maternal age) multigravida 35+, first trimester Declined NIPS  3. Pre-existing type 2 diabetes  mellitus during pregnancy in second trimester Patient is not taking glyburide- she states it is hard for her to remember taking pills and she also doesn't like taking pills CBGs reviewed and fasting in 130's and pp 150's- 220 Size greater than dates- growth ultrasound on 3/8 Patient scheduled for fetal echo on 02/25/2017  4. Pre-existing essential hypertension during pregnancy in second trimester Patient is not taking procardia or ASA for the same reasons stated above Follow up growth ultrasound on 3/8 Again I discussed the maternal/fetal impact of non-compliance with the patient who states that she has a strong belief in the higher power and feels strongly that her child will be spared from harm  Preterm labor symptoms and general obstetric precautions including but not limited to vaginal bleeding, contractions, leaking of fluid and fetal movement were reviewed in detail with the patient. Please refer to After Visit Summary for other counseling recommendations.  Return in about 2 weeks (around 02/24/2017) for ROB.   Catalina AntiguaPeggy Jamillah Camilo, MD

## 2017-02-14 ENCOUNTER — Ambulatory Visit: Payer: Medicaid - Out of State | Admitting: Family Medicine

## 2017-02-24 ENCOUNTER — Ambulatory Visit (HOSPITAL_COMMUNITY)
Admission: RE | Admit: 2017-02-24 | Discharge: 2017-02-24 | Disposition: A | Discharge status: Home / Self Care | Payer: Self-pay | Source: Ambulatory Visit | Attending: Obstetrics & Gynecology | Admitting: Obstetrics & Gynecology

## 2017-02-28 ENCOUNTER — Ambulatory Visit: Payer: Self-pay | Admitting: Family Medicine

## 2017-03-01 ENCOUNTER — Encounter (HOSPITAL_COMMUNITY): Payer: Self-pay

## 2017-03-01 ENCOUNTER — Ambulatory Visit (HOSPITAL_COMMUNITY)
Admission: RE | Admit: 2017-03-01 | Discharge: 2017-03-01 | Disposition: A | Discharge status: Home / Self Care | Payer: Self-pay | Source: Ambulatory Visit | Attending: Obstetrics & Gynecology | Admitting: Obstetrics & Gynecology

## 2017-03-01 DIAGNOSIS — O10919 Unspecified pre-existing hypertension complicating pregnancy, unspecified trimester: Secondary | ICD-10-CM

## 2017-03-01 DIAGNOSIS — O24112 Pre-existing diabetes mellitus, type 2, in pregnancy, second trimester: Secondary | ICD-10-CM

## 2017-03-01 DIAGNOSIS — O09512 Supervision of elderly primigravida, second trimester: Secondary | ICD-10-CM | POA: Insufficient documentation

## 2017-03-01 DIAGNOSIS — O99212 Obesity complicating pregnancy, second trimester: Secondary | ICD-10-CM | POA: Insufficient documentation

## 2017-03-01 DIAGNOSIS — O4400 Placenta previa specified as without hemorrhage, unspecified trimester: Secondary | ICD-10-CM

## 2017-03-01 DIAGNOSIS — O0992 Supervision of high risk pregnancy, unspecified, second trimester: Secondary | ICD-10-CM

## 2017-03-01 DIAGNOSIS — Z3A26 26 weeks gestation of pregnancy: Secondary | ICD-10-CM | POA: Insufficient documentation

## 2017-03-01 DIAGNOSIS — O10012 Pre-existing essential hypertension complicating pregnancy, second trimester: Secondary | ICD-10-CM | POA: Insufficient documentation

## 2017-03-01 DIAGNOSIS — O24312 Unspecified pre-existing diabetes mellitus in pregnancy, second trimester: Secondary | ICD-10-CM | POA: Insufficient documentation

## 2017-03-08 ENCOUNTER — Other Ambulatory Visit (HOSPITAL_COMMUNITY): Payer: Self-pay | Admitting: *Deleted

## 2017-03-08 ENCOUNTER — Encounter: Payer: Self-pay | Admitting: Obstetrics and Gynecology

## 2017-03-08 DIAGNOSIS — O24119 Pre-existing diabetes mellitus, type 2, in pregnancy, unspecified trimester: Secondary | ICD-10-CM

## 2017-03-09 ENCOUNTER — Encounter: Payer: Self-pay | Admitting: Family Medicine

## 2017-03-17 ENCOUNTER — Encounter: Payer: Self-pay | Admitting: Family Medicine

## 2017-03-22 ENCOUNTER — Encounter: Payer: Self-pay | Admitting: Family Medicine

## 2017-03-22 ENCOUNTER — Encounter (HOSPITAL_COMMUNITY): Payer: Self-pay

## 2017-03-22 ENCOUNTER — Inpatient Hospital Stay (HOSPITAL_COMMUNITY)
Admission: AD | Admit: 2017-03-22 | Discharge: 2017-03-22 | Disposition: A | Discharge status: Home / Self Care | Payer: Self-pay | Source: Ambulatory Visit | Attending: Family Medicine | Admitting: Family Medicine

## 2017-03-22 ENCOUNTER — Ambulatory Visit (INDEPENDENT_AMBULATORY_CARE_PROVIDER_SITE_OTHER): Payer: Self-pay | Admitting: Family Medicine

## 2017-03-22 VITALS — BP 191/97 | HR 57 | Temp 98.6°F | Ht 70.0 in | Wt 279.0 lb

## 2017-03-22 DIAGNOSIS — Z833 Family history of diabetes mellitus: Secondary | ICD-10-CM | POA: Insufficient documentation

## 2017-03-22 DIAGNOSIS — O09513 Supervision of elderly primigravida, third trimester: Secondary | ICD-10-CM | POA: Insufficient documentation

## 2017-03-22 DIAGNOSIS — O24913 Unspecified diabetes mellitus in pregnancy, third trimester: Secondary | ICD-10-CM | POA: Insufficient documentation

## 2017-03-22 DIAGNOSIS — Z8249 Family history of ischemic heart disease and other diseases of the circulatory system: Secondary | ICD-10-CM | POA: Insufficient documentation

## 2017-03-22 DIAGNOSIS — E282 Polycystic ovarian syndrome: Secondary | ICD-10-CM | POA: Insufficient documentation

## 2017-03-22 DIAGNOSIS — Z3A29 29 weeks gestation of pregnancy: Secondary | ICD-10-CM | POA: Insufficient documentation

## 2017-03-22 DIAGNOSIS — Z888 Allergy status to other drugs, medicaments and biological substances status: Secondary | ICD-10-CM | POA: Insufficient documentation

## 2017-03-22 DIAGNOSIS — O10919 Unspecified pre-existing hypertension complicating pregnancy, unspecified trimester: Secondary | ICD-10-CM

## 2017-03-22 DIAGNOSIS — Z9114 Patient's other noncompliance with medication regimen: Secondary | ICD-10-CM | POA: Insufficient documentation

## 2017-03-22 DIAGNOSIS — Z7984 Long term (current) use of oral hypoglycemic drugs: Secondary | ICD-10-CM | POA: Insufficient documentation

## 2017-03-22 DIAGNOSIS — O10913 Unspecified pre-existing hypertension complicating pregnancy, third trimester: Secondary | ICD-10-CM | POA: Insufficient documentation

## 2017-03-22 DIAGNOSIS — Z789 Other specified health status: Secondary | ICD-10-CM

## 2017-03-22 DIAGNOSIS — O99283 Endocrine, nutritional and metabolic diseases complicating pregnancy, third trimester: Secondary | ICD-10-CM | POA: Insufficient documentation

## 2017-03-22 LAB — PROTEIN / CREATININE RATIO, URINE
Creatinine, Urine: 164 mg/dL
Protein Creatinine Ratio: 0.07 mg/mg{Cre} (ref 0.00–0.15)
Total Protein, Urine: 11 mg/dL

## 2017-03-22 LAB — CBC
HCT: 34.9 % — ABNORMAL LOW (ref 36.0–46.0)
Hemoglobin: 12 g/dL (ref 12.0–15.0)
MCH: 28.9 pg (ref 26.0–34.0)
MCHC: 34.4 g/dL (ref 30.0–36.0)
MCV: 84.1 fL (ref 78.0–100.0)
PLATELETS: 265 10*3/uL (ref 150–400)
RBC: 4.15 MIL/uL (ref 3.87–5.11)
RDW: 13.2 % (ref 11.5–15.5)
WBC: 8.1 10*3/uL (ref 4.0–10.5)

## 2017-03-22 LAB — COMPREHENSIVE METABOLIC PANEL
ALT: 13 U/L — ABNORMAL LOW (ref 14–54)
AST: 15 U/L (ref 15–41)
Albumin: 3.1 g/dL — ABNORMAL LOW (ref 3.5–5.0)
Alkaline Phosphatase: 64 U/L (ref 38–126)
Anion gap: 7 (ref 5–15)
BUN: 8 mg/dL (ref 6–20)
CHLORIDE: 106 mmol/L (ref 101–111)
CO2: 24 mmol/L (ref 22–32)
CREATININE: 0.57 mg/dL (ref 0.44–1.00)
Calcium: 9.2 mg/dL (ref 8.9–10.3)
GFR calc non Af Amer: >60 mL/min (ref >60)
GLUCOSE: 128 mg/dL — AB (ref 65–99)
Potassium: 4.4 mmol/L (ref 3.5–5.1)
SODIUM: 137 mmol/L (ref 135–145)
Total Bilirubin: 0.8 mg/dL (ref 0.3–1.2)
Total Protein: 6.6 g/dL (ref 6.5–8.1)

## 2017-03-22 MED ORDER — LABETALOL HCL 5 MG/ML IV SOLN: 20.0000 mg | INTRAVENOUS | Status: DC | PRN

## 2017-03-22 MED ORDER — LACTATED RINGERS IV SOLN
INTRAVENOUS | Status: DC
  Administered 2017-03-22: 14:00:00 via INTRAVENOUS

## 2017-03-22 MED ORDER — NIFEDIPINE ER OSMOTIC RELEASE 30 MG PO TB24: 30.0000 mg | ORAL_TABLET | Freq: Two times a day (BID) | ORAL | 0 refills | Status: DC

## 2017-03-22 MED ORDER — HYDRALAZINE HCL 20 MG/ML IJ SOLN
10.0000 mg | Freq: Once | INTRAMUSCULAR | Status: DC | PRN
  Filled 2017-03-22: qty 1

## 2017-03-22 MED ORDER — HYDRALAZINE HCL 20 MG/ML IJ SOLN
20.0000 mg | Freq: Once | INTRAMUSCULAR | Status: AC | PRN
  Administered 2017-03-22: 20 mg via INTRAVENOUS

## 2017-03-22 NOTE — Progress Notes (Signed)
Pt allowed IV start. Pt refusing all medications. Np notified.

## 2017-03-22 NOTE — Progress Notes (Signed)
Dr Adrian Blackwater at pt's bedside. MD discussing with pt importance of receiving medication for her blood pressure. Pt agreeable to receive one IV dose of hydralazine after lengthy discussion.

## 2017-03-22 NOTE — Progress Notes (Signed)
Monitors d/c'd. Pt refuses monitors. Pt states the fetal monitor is putting bad radio waves into the baby and hurting the baby.

## 2017-03-22 NOTE — MAU Provider Note (Signed)
History     CSN: [id number]  Arrival date and time: 03/22/17 1301   None     Chief Complaint  Patient presents with  . Hypertension   HPI  Latoya Strickland is a 41 y.o. G1P0000 at 63w5dwho presents for hypertension. Hx of hypertension & diabetes-- non compliant with meds & no showed for last 3 ob appointments.  Went to PCP today for BP check -- BP in office was 191/97; pt was told to come to MAU for evaluation. Pt denies headache, epigastric pain, or vision changes.   OB History    Gravida Para Term Preterm AB Living   1 0 0 0 0 0   SAB TAB Ectopic Multiple Live Births   0 0 0 0 0      Past Medical History:  Diagnosis Date  . Arthritis   . Blood transfusion without reported diagnosis   . Carpal tunnel syndrome   . Degenerative joint disease   . Diabetes mellitus without complication ([place]   . DJD (degenerative joint disease)   . Hypertension   . Lumbar herniated disc   . PCOS (polycystic ovarian syndrome)     Past Surgical History:  Procedure Laterality Date  . WISDOM TOOTH EXTRACTION      Family History  Problem Relation Age of Onset  . Hypertension Mother   . Heart disease Mother   . Varicose Veins Mother   . Cancer Father   . Diabetes Father   . Diabetes Paternal Grandmother     Social History  Substance Use Topics  . Smoking status: Never Smoker  . Smokeless tobacco: Never Used  . Alcohol use No    Allergies:  Allergies  Allergen Reactions  . Lyrica [Pregabalin] Other (See Comments)    Hair loss   . Ultracet [Tramadol-Acetaminophen] Other (See Comments)    Stomach burning   . Ultram [Tramadol] Other (See Comments)    Stomach burning     Prescriptions Prior to Admission  Medication Sig Dispense Refill Last Dose  . Blood Glucose Monitoring Suppl (ACCU-CHEK NANO SMARTVIEW) w/Device KIT 1 kit by Subdermal route as directed. Check blood sugars for fasting, and two hours after breakfast, lunch and dinner (4 checks daily) 1 kit 0 Taking  .  glyBURIDE (DIABETA) 2.5 MG tablet Take 1 tablet (2.5 mg total) by mouth 2 (two) times daily with a meal. 60 tablet 3 Taking  . NIFEdipine (PROCARDIA XL) 30 MG 24 hr tablet Take 1 tablet (30 mg total) by mouth 2 (two) times daily. 60 tablet 3 Taking  . Prenatal MV-Min-FA-Omega-3 (PRENATAL GUMMIES/DHA & FA PO) Take 1.25 tablets by mouth daily.   Taking    Review of Systems  Constitutional: Negative.   Eyes: Negative for visual disturbance.  Gastrointestinal: Negative.   Neurological: Negative for dizziness and headaches.   Physical Exam   Blood pressure (!) 221/119, pulse 77, temperature 97.9 F (36.6 C), temperature source Oral, resp. rate 16, weight 279 lb (126.6 kg), last menstrual period 08/13/2016, SpO2 100 %.  Temp:  [97.9 F (36.6 C)-98.6 F (37 C)] 97.9 F (36.6 C) (04/03 1314) Pulse Rate:  [57-89] 73 (04/03 1605) Resp:  [16-18] 18 (04/03 1404) BP: (164-221)/(76-119) 183/76 (04/03 1605) SpO2:  [99 %-100 %] 100 % (04/03 1605) Weight:  [279 lb (126.6 kg)] 279 lb (126.6 kg) (04/03 1314)   Physical Exam  Nursing note and vitals reviewed. Constitutional: She is oriented to person, place, and time. She appears well-developed and well-nourished. No distress.  HENT:  Head: Normocephalic and atraumatic.  Eyes: Conjunctivae are normal. Right eye exhibits no discharge. Left eye exhibits no discharge. No scleral icterus.  Neck: Normal range of motion.  Cardiovascular: Normal rate, regular rhythm and normal heart sounds.   No murmur heard. Respiratory: Effort normal and breath sounds normal. No respiratory distress. She has no wheezes.  GI: Soft.  Neurological: She is alert and oriented to person, place, and time.  Skin: Skin is warm and dry. She is not diaphoretic.  Psychiatric: She has a normal mood and affect. Her behavior is normal. Judgment and thought content normal.    MAU Course  Procedures Results for orders placed or performed during the hospital encounter of 03/22/17  (from the past 24 hour(s))  Protein / creatinine ratio, urine     Status: None   Collection Time: 03/22/17  1:20 PM  Result Value Ref Range   Creatinine, Urine 164.00 mg/dL   Total Protein, Urine 11 mg/dL   Protein Creatinine Ratio 0.07 0.00 - 0.15 mg/mg[Cre]  Comprehensive metabolic panel     Status: Abnormal   Collection Time: 03/22/17  1:22 PM  Result Value Ref Range   Sodium 137 135 - 145 mmol/L   Potassium 4.4 3.5 - 5.1 mmol/L   Chloride 106 101 - 111 mmol/L   CO2 24 22 - 32 mmol/L   Glucose, Bld 128 (H) 65 - 99 mg/dL   BUN 8 6 - 20 mg/dL   Creatinine, Ser 0.57 0.44 - 1.00 mg/dL   Calcium 9.2 8.9 - 10.3 mg/dL   Total Protein 6.6 6.5 - 8.1 g/dL   Albumin 3.1 (L) 3.5 - 5.0 g/dL   AST 15 15 - 41 U/L   ALT 13 (L) 14 - 54 U/L   Alkaline Phosphatase 64 38 - 126 U/L   Total Bilirubin 0.8 0.3 - 1.2 mg/dL   GFR calc non Af Amer >60 >60 mL/min   GFR calc Af Amer >60 >60 mL/min   Anion gap 7 5 - 15  CBC     Status: Abnormal   Collection Time: 03/22/17  1:22 PM  Result Value Ref Range   WBC 8.1 4.0 - 10.5 K/uL   RBC 4.15 3.87 - 5.11 MIL/uL   Hemoglobin 12.0 12.0 - 15.0 g/dL   HCT 34.9 (L) 36.0 - 46.0 %   MCV 84.1 78.0 - 100.0 fL   MCH 28.9 26.0 - 34.0 pg   MCHC 34.4 30.0 - 36.0 g/dL   RDW 13.2 11.5 - 15.5 %   Platelets 265 150 - 400 K/uL    MDM CBC, CMP, urine PCR Labetalol protocol ordered for severe range BPs Pt refusing labetalol. Spouse concerned about labetalol d/t possible "mercury poisoning".  Dr. Nehemiah Settle called to come speak with patient. Dr. Nehemiah Settle s/w patient & pt agreeable to IV hydral 20 mg while labs pending.  BPs decreased after hydral but still severe range. Hyder labs wnl.  Fetal monitoring -- intermittent tracing. Patient & husband refused further fetal monitoring as they say the "waves from the monitor are hurting their baby".  Discussed recent BPs, pt's refusal of fetal monitoring, & lab results. Will discharge home & have patient restart procardia xl 30 mg  BID. Patient has f/u appointment with Dr. Nehemiah Settle on Thursday.   Assessment and Plan  A; 1. Chronic hypertension with exacerbation during pregnancy in third trimester   2. Noncompliance with medications    P: Discharge home Return to MAU for s/s preeclampsia Rx procardia Keep appt  with Dr. Nehemiah Settle 4/5  Jorje Guild 03/22/2017, 1:56 PM

## 2017-03-22 NOTE — MAU Note (Signed)
Pt is refusing labetalol, says she throws up when she took it last.

## 2017-03-22 NOTE — MAU Note (Signed)
Went to dr for diabetes today, BP was up.  Has noted an increase in swelling.   Noted a decrease in fetal movement yesterday.  No headache, denies epigastric pain or visual changes.

## 2017-03-22 NOTE — Progress Notes (Signed)
Patient ID: Latoya Strickland, female    DOB: [birth year], 41 y.o.   MRN: [id number]  PCP: Molli Barrows, FNP  Chief Complaint  Patient presents with  . Follow-up    3 MONTH/DIABETES    Subjective:  HPI Counseling only visit  Latoya Strickland is a 41 y.o. female presents for evaluation of elevated blood pressure. Latoya Strickland reports that she is 7 months pregnant. Last OB/GYN appointment 02/10/2017. Patient's blood pressure on arrival at clinic was 197/97. Upon learning of patient pregnancy status, I Molli Barrows) entered the room to advise patient that this clinic is only manages non-obstetrical patient needs and she really needs to follow-up at her OB office and more appropriately, she should be evaluated at [facility] hospital. Patient requested that I call her OB office to see if she could be seen today. The triage nurse confirmed with OB that patient needs to go directly to [facility] for evaluation.   Social History  Substance Use Topics  . Smoking status: Never Smoker  . Smokeless tobacco: Never Used  . Alcohol use No    Family History  Problem Relation Age of Onset  . Hypertension Mother   . Heart disease Mother   . Varicose Veins Mother   . Cancer Father   . Diabetes Father   . Diabetes Paternal Grandmother    Review of Systems See HPI Patient Active Problem List   Diagnosis Date Noted  . Placenta previa antepartum 01/06/2017  . Hemorrhoids during pregnancy 11/29/2016  . Marijuana use 11/20/2016  . Supervision of high-risk pregnancy 11/15/2016  . Hypertension in pregnancy 11/15/2016  . Diabetes in pregnancy 11/15/2016  . AMA (advanced maternal age) multigravida 77+, first trimester 11/15/2016  . Diabetes ([place]) 08/09/2016  . Essential hypertension 08/09/2016  . DJD (degenerative joint disease) 08/09/2016    Allergies  Allergen Reactions  . Lyrica [Pregabalin] Other (See Comments)    Hair loss   . Ultracet [Tramadol-Acetaminophen] Other (See Comments)    Stomach  burning   . Ultram [Tramadol] Other (See Comments)    Stomach burning     Prior to Admission medications   Medication Sig Start Date End Date Taking? Authorizing Provider  Blood Glucose Monitoring Suppl (ACCU-CHEK NANO SMARTVIEW) w/Device KIT 1 kit by Subdermal route as directed. Check blood sugars for fasting, and two hours after breakfast, lunch and dinner (4 checks daily) 01/06/17  Yes Peggy Constant, MD  glyBURIDE (DIABETA) 2.5 MG tablet Take 1 tablet (2.5 mg total) by mouth 2 (two) times daily with a meal. 11/15/16  Yes Guss Bunde, MD  NIFEdipine (PROCARDIA XL) 30 MG 24 hr tablet Take 1 tablet (30 mg total) by mouth 2 (two) times daily. 11/25/16  Yes Truett Mainland, DO  Prenatal MV-Min-FA-Omega-3 (PRENATAL GUMMIES/DHA & FA PO) Take 1.25 tablets by mouth daily.   Yes Historical Provider, MD    Past Medical, Surgical Family and Social History reviewed and updated.    Objective:   Today's Vitals   03/22/17 1118  BP: (!) 191/97  Pulse: (!) 57  Temp: 98.6 F (37 C)  TempSrc: Oral  Weight: 279 lb (126.6 kg)  Height: 5' 10"  (1.778 m)    Wt Readings from Last 3 Encounters:  03/22/17 279 lb (126.6 kg)  03/01/17 269 lb 6 oz (122.2 kg)  02/10/17 268 lb 14.4 oz (122 kg)    Physical Exam Deferred      Assessment & Plan:  1. Health management deficit 2. HTN in pregnancy, chronic  Upon arrival today in  clinic patient smelled of cigarette or some form of smoke substance. She appears to have a significant self care deficit as she admitted to inconsistent home medication administration.  Explained the potential harm to fetus of elevated blood pressure and uncontrolled diabetes. Latoya Strickland seemed very nonchalant regard potential harms to pregnancy. After begging and pleading with patient she agreed to go to [facility]. After interacting with patient I have great concern regarding the outcomes and welfare of fetus during this pregnancy. I am   and reports that she wanted to be  evaluated for elevated blood pressure and lower extremity swelling.   Carroll Sage. Kenton Kingfisher, MSN, [facility] [address] [place], [place] [address] [phone]

## 2017-03-22 NOTE — Discharge Instructions (Signed)
Hypertension During Pregnancy Hypertension, commonly called high blood pressure, is when the force of blood pumping through your arteries is too strong. Arteries are blood vessels that carry blood from the heart throughout the body. Hypertension during pregnancy can cause problems for you and your baby. Your baby may be born early (prematurely) or may not weigh as much as he or she should at birth. Very bad cases of hypertension during pregnancy can be life-threatening. Different types of hypertension can occur during pregnancy. These include:  Chronic hypertension. This happens when:  You have hypertension before pregnancy and it continues during pregnancy.  You develop hypertension before you are [redacted] weeks pregnant, and it continues during pregnancy.  Gestational hypertension. This is hypertension that develops after the 20th week of pregnancy.  Preeclampsia, also called toxemia of pregnancy. This is a very serious type of hypertension that develops only during pregnancy. It affects the whole body, and it can be very dangerous for you and your baby. Gestational hypertension and preeclampsia usually go away within 6 weeks after your baby is born. Women who have hypertension during pregnancy have a greater chance of developing hypertension later in life or during future pregnancies. What are the causes? The exact cause of hypertension is not known. What increases the risk? There are certain factors that make it more likely for you to develop hypertension during pregnancy. These include:  Having hypertension during a previous pregnancy or prior to pregnancy.  Being overweight.  Being older than age 10.  Being pregnant for the first time or being pregnant with more than one baby.  Becoming pregnant using fertilization methods such as IVF (in vitro fertilization).  Having diabetes, kidney problems, or systemic lupus erythematosus.  Having a family history of hypertension. What are the  signs or symptoms? Chronic hypertension and gestational hypertension rarely cause symptoms. Preeclampsia causes symptoms, which may include:  Increased protein in your urine. Your health care provider will check for this at every visit before you give birth (prenatal visit).  Severe headaches.  Sudden weight gain.  Swelling of the hands, face, legs, and feet.  Nausea and vomiting.  Vision problems, such as blurred or double vision.  Numbness in the face, arms, legs, and feet.  Dizziness.  Slurred speech.  Sensitivity to bright lights.  Abdominal pain.  Convulsions. How is this diagnosed? You may be diagnosed with hypertension during a routine prenatal exam. At each prenatal visit, you may:  Have a urine test to check for high amounts of protein in your urine.  Have your blood pressure checked. A blood pressure reading is recorded as two numbers, such as "120 over 80" (or 120/80). The first ("top") number is called the systolic pressure. It is a measure of the pressure in your arteries when your heart beats. The second ("bottom") number is called the diastolic pressure. It is a measure of the pressure in your arteries as your heart relaxes between beats. Blood pressure is measured in a unit called mm Hg. A normal blood pressure reading is:  Systolic: below 287.  Diastolic: below 80. The type of hypertension that you are diagnosed with depends on your test results and when your symptoms developed.  Chronic hypertension is usually diagnosed before 20 weeks of pregnancy.  Gestational hypertension is usually diagnosed after 20 weeks of pregnancy.  Hypertension with high amounts of protein in the urine is diagnosed as preeclampsia.  Blood pressure measurements that stay above 681 systolic, or above 157 diastolic, are signs of severe preeclampsia.  How is this treated? Treatment for hypertension during pregnancy varies depending on the type of hypertension you have and how  serious it is.  If you take medicines called ACE inhibitors to treat chronic hypertension, you may need to switch medicines. ACE inhibitors should not be taken during pregnancy.  If you have gestational hypertension, you may need to take blood pressure medicine.  If you are at risk for preeclampsia, your health care provider may recommend that you take a low-dose aspirin every day to prevent high blood pressure during your pregnancy.  If you have severe preeclampsia, you may need to be hospitalized so you and your baby can be monitored closely. You may also need to take medicine (magnesium sulfate) to prevent seizures and to lower blood pressure. This medicine may be given as an injection or through an IV tube.  In some cases, if your condition gets worse, you may need to deliver your baby early. Follow these instructions at home: Eating and drinking   Drink enough fluid to keep your urine clear or pale yellow.  Eat a healthy diet that is low in salt (sodium). Do not add salt to your food. Check food labels to see how much sodium a food or beverage contains. Lifestyle   Do not use any products that contain nicotine or tobacco, such as cigarettes and e-cigarettes. If you need help quitting, ask your health care provider.  Do not use alcohol.  Avoid caffeine.  Avoid stress as much as possible. Rest and get plenty of sleep. General instructions   Take over-the-counter and prescription medicines only as told by your health care provider.  While lying down, lie on your left side. This keeps pressure off your baby.  While sitting or lying down, raise (elevate) your feet. Try putting some pillows under your lower legs.  Exercise regularly. Ask your health care provider what kinds of exercise are best for you.  Keep all prenatal and follow-up visits as told by your health care provider. This is important. Contact a health care provider if:  You have symptoms that your health care  provider told you may require more treatment or monitoring, such as:  Fever.  Vomiting.  Headache. Get help right away if:  You have severe abdominal pain or vomiting that does not get better with treatment.  You suddenly develop swelling in your hands, ankles, or face.  You gain 4 lbs (1.8 kg) or more in 1 week.  You develop vaginal bleeding, or you have blood in your urine.  You do not feel your baby moving as much as usual.  You have blurred or double vision.  You have muscle twitching or sudden tightening (spasms).  You have shortness of breath.  Your lips or fingernails turn blue. This information is not intended to replace advice given to you by your health care provider. Make sure you discuss any questions you have with your health care provider. Document Released: 08/24/2011 Document Revised: 06/25/2016 Document Reviewed: 05/21/2016 Elsevier Interactive Patient Education  2017 [employer].      Type 1 or Type 2 Diabetes Mellitus During Pregnancy, Self Care Caring for yourself during your pregnancy when you have type 1 diabetes (type 1 diabetes mellitus) or type 2 diabetes (type 2 diabetes mellitus) means keeping your blood sugar (glucose) under control with a balance of:  Nutrition.  Exercise.  Lifestyle changes.  Insulin or medicines, if necessary.  Support from your team of health care providers and others. The following information explains  what you need to know to manage your diabetes at home during your pregnancy. What do I need to do to manage my blood glucose?  Check your blood glucose every day, as often as told by your health care provider.  Contact your health care provider if your blood glucose is above your target for 2 tests in a row.  Have your A1c (hemoglobin A1c) level checked at least two times a year, or as often as told by your health care provider. Your health care provider will set individualized treatment goals for you. Generally,  the goal of treatment is to maintain the following blood glucose levels during pregnancy:  After not eating (after fasting) for 8 hours: at or below 95 mg/dL (5.3 mmol/L).  After meals (postprandial):  One hour after a meal: at or below 140 mg/dL (7.8 mmol/L).  Two hours after a meal: at or below 120 mg/dL (6.7 mmol/L).  A1c level: 6-6.5% What do I need to know about hyperglycemia and hypoglycemia? What is hyperglycemia? Hyperglycemia, also called high blood glucose, occurs when blood glucose is too high. Make sure you know the early signs of hyperglycemia, such as:  Increased thirst.  Hunger.  Feeling very tired.  Needing to urinate more often than usual.  Blurry vision. What is hypoglycemia?  Hypoglycemia, also called low blood glucose, occurs with a blood glucose level at or below 70 mg/dL (3.9 mmol/L). The risk for hypoglycemia increases during or after exercise, during sleep, during illness, and when skipping meals or fasting. It is important to know the symptoms of hypoglycemia and treat it right away. Always have a 15-gram rapid-acting carbohydrate snack with you to treat low blood glucose. Family members and close friends should also know the symptoms and should understand how to treat hypoglycemia, in case you are not able to treat yourself. What are the symptoms of hypoglycemia?  Hypoglycemia symptoms can include:  Hunger.  Anxiety.  Sweating and feeling clammy.  Confusion.  Dizziness or feeling light-headed.  Sleepiness.  Nausea.  Increased heart rate.  Headache.  Blurry vision.  Seizure.  Nightmares.  Tingling or numbness around the mouth, lips, or tongue.  A change in speech.  Decreased ability to concentrate.  A change in coordination.  Restless sleep.  Tremors or shakes.  Fainting.  Irritability. How do I treat hypoglycemia?   If you are alert and able to swallow safely, follow the 15:15 rule:  Take 15 grams of a rapid-acting  carbohydrate. Rapid-acting options include:  1 tube of glucose gel.  3 glucose pills.  6-8 pieces of hard candy.  4 oz (120 mL) of fruit juice .  4 oz (120 mL) of regular (not diet) soda.  Check your blood glucose 15 minutes after you take the carbohydrate.  If the repeat blood glucose level is still at or below 70 mg/dL (3.9 mmol/L), take 15 grams of a carbohydrate again.  If your blood glucose level does not increase above 70 mg/dL (3.9 mmol/L) after 3 tries, seek emergency medical care.  After your blood glucose level returns to normal, eat a meal or a snack within 1 hour. How do I treat severe hypoglycemia?  Severe hypoglycemia is when your blood glucose level is at or below 54 mg/dL (3 mmol/L). Severe hypoglycemia is an emergency. Do not wait to see if the symptoms will go away. Get medical help right away. Call your local emergency services (911 in the [place] Do not drive yourself to the hospital. If you have severe  hypoglycemia and you cannot eat or drink, you may need an injection of glucagon. A family member or close friend should learn how to check your blood glucose and how to give you a glucagon injection. Ask your health care provider if you need to have an emergency glucagon injection kit available. Severe hypoglycemia may need to be treated in a hospital. The treatment may include getting glucose through an IV tube. You may also need treatment for the cause of your hypoglycemia. Can having diabetes put me at risk for other conditions? Having diabetes can put you at risk for other long-term (chronic) conditions, such as heart disease and kidney disease. Your health care provider may prescribe medicines to help prevent complications from diabetes. These medicines may include:  Aspirin.  Medicine to lower cholesterol.  Medicine to control blood pressure. What else can I do to manage my diabetes? Take your diabetes medicines as told   If your health care provider  prescribed insulin or diabetes medicines, take them every day.  Do not run out of insulin or other diabetes medicines that you take. Plan ahead so you always have these available.  If you use insulin, adjust your dosage based on how physically active you are and what foods you eat. Your health care provider will tell you how to adjust your dosage. Your health care provider may recommend that you take one low-dose aspirin (81 mg) each day to help prevent high blood pressure during pregnancy (preeclampsia or eclampsia). You may be at risk for preeclampsia or eclampsia if:  You had any of the following during a previous pregnancy:  Preeclampsia or eclampsia.  A fetal growth rate that was slower than normal.  An early (preterm) birth.  Separation of the placenta from the uterus (placental abruption).  Fetal loss.  You are pregnant with more than one baby.  You have other medical conditions, such as high blood pressure or an autoimmune disease. Make healthy food choices   The things that you eat and drink affect your blood glucose and your insulin dosage. Making good choices helps to control your diabetes and prevent other health problems. A healthy meal plan includes eating lean proteins, complex carbohydrates, fresh fruits and vegetables, low-fat dairy products, and healthy fats. Make an appointment to see a diet and nutrition specialist (registered dietitian) to help you create an eating plan that is right for you. Make sure that you:  Follow instructions from your health care provider about eating or drinking restrictions.  Drink enough fluid to keep your urine clear or pale yellow.  Eat healthy snacks between nutritious meals.  Track the carbohydrates that you eat. Do this by reading food labels and learning the standard serving sizes of foods.  Follow your sick day plan whenever you cannot eat or drink as usual. Make this plan in advance with your health care provider. Stay  active    Do at least 30 minutes of physical activity a day, or as much physical activity as your health care provider recommends during your pregnancy.  If you start a new exercise or activity, work with your health care provider to adjust your insulin, medicines, or food intake as needed. Make healthy lifestyle choices   Do not use any tobacco products, such as cigarettes, chewing tobacco, and e-cigarettes. If you need help quitting, ask your health care provider.  Do not use alcohol.  Learn to manage stress. If you need help with this, ask your health care provider. Care for your body  Keep your immunizations up to date.  Schedule an eye exam during your first trimester of your pregnancy, or as told by your health care provider.  Check your skin and feet every day for cuts, bruises, redness, blisters, or sores. Schedule a foot exam with your health care provider once every year.  Brush your teeth and gums two times a day, and floss at least one time a day. Visit your dentist at least once every 6 months.  Maintain a healthy weight during your pregnancy. General instructions    Take over-the-counter and prescription medicines only as told by your health care provider.  Talk with your health care provider about your risk for high blood pressure during pregnancy (preeclampsia or eclampsia).  Share your diabetes management plan with people in your workplace, school, and household.  Check your urine ketones when you are ill and as told by your health care provider.  Carry a medical alert card or wear medical alert jewelry.  Ask your health care provider:  Do I need to meet with a diabetes educator?  Where can I find a support group for people with diabetes?  Keep all follow-up visits during your pregnancy (prenatal) and after delivery (postnatal) as told by your health care provider. This is important. Where to find more information: For more information about diabetes,  visit:  American Diabetes Association (ADA): www.diabetes.org  American Association of Diabetes Educators (AADE): https://www.diabeteseducator.org/patient-resources This information is not intended to replace advice given to you by your health care provider. Make sure you discuss any questions you have with your health care provider. Document Released: 03/29/2016 Document Revised: 05/13/2016 Document Reviewed: 01/09/2016 Elsevier Interactive Patient Education  2017 [employer].

## 2017-03-24 ENCOUNTER — Ambulatory Visit (INDEPENDENT_AMBULATORY_CARE_PROVIDER_SITE_OTHER): Payer: Self-pay | Admitting: Clinical

## 2017-03-24 ENCOUNTER — Ambulatory Visit (INDEPENDENT_AMBULATORY_CARE_PROVIDER_SITE_OTHER): Payer: Self-pay | Admitting: Family Medicine

## 2017-03-24 VITALS — BP 174/100 | Wt 276.0 lb

## 2017-03-24 DIAGNOSIS — O10013 Pre-existing essential hypertension complicating pregnancy, third trimester: Secondary | ICD-10-CM

## 2017-03-24 DIAGNOSIS — O0993 Supervision of high risk pregnancy, unspecified, third trimester: Secondary | ICD-10-CM

## 2017-03-24 DIAGNOSIS — F4323 Adjustment disorder with mixed anxiety and depressed mood: Secondary | ICD-10-CM

## 2017-03-24 DIAGNOSIS — O0992 Supervision of high risk pregnancy, unspecified, second trimester: Secondary | ICD-10-CM

## 2017-03-24 DIAGNOSIS — O09521 Supervision of elderly multigravida, first trimester: Secondary | ICD-10-CM

## 2017-03-24 DIAGNOSIS — E139 Other specified diabetes mellitus without complications: Secondary | ICD-10-CM

## 2017-03-24 DIAGNOSIS — O10012 Pre-existing essential hypertension complicating pregnancy, second trimester: Secondary | ICD-10-CM

## 2017-03-24 DIAGNOSIS — O24813 Other pre-existing diabetes mellitus in pregnancy, third trimester: Secondary | ICD-10-CM

## 2017-03-24 DIAGNOSIS — O09523 Supervision of elderly multigravida, third trimester: Secondary | ICD-10-CM

## 2017-03-24 MED ORDER — NIFEDIPINE ER OSMOTIC RELEASE 30 MG PO TB24: 30.0000 mg | ORAL_TABLET | Freq: Two times a day (BID) | ORAL | 3 refills | Status: DC

## 2017-03-24 NOTE — Progress Notes (Signed)
Pt not taking any meds. She would like to be tested for hepatitis c.

## 2017-03-24 NOTE — BH Specialist Note (Signed)
Integrated Behavioral Health Initial Visit  MRN: [id number] Name: Latoya Strickland   Session Start time: 2:05 Session End time: 2:25 Total time: 20 minutes  Type of Service: Integrated Behavioral Health- Individual/Family Interpretor:No. Interpretor Name and Language: n/a    Warm Hand Off Completed.       SUBJECTIVE: Latoya Strickland is a 41 y.o. female accompanied by patient. Patient was referred by Dr Adrian Blackwater for psychosocial stressors. Patient reports the following symptoms/concerns: Pt states that her primary concern is worry over family and extended family problems, including worry that others may harm her by "throwing roots". Pt says this is causing her to feel depressed and tired. Duration of problem: Current pregnancy; Severity of problem: mild  OBJECTIVE: Mood: Anxious and Depressed and Affect: Tearful Risk of harm to self or others: No plan to harm self or others   LIFE CONTEXT: Family and Social: Lives with FOB, sister, and other extended family members School/Work: disability Self-Care: - Life Changes: Current pregnancy  GOALS ADDRESSED: Patient will reduce symptoms of: anxiety and depression    INTERVENTIONS: Motivational Interviewing  Standardized Assessments completed: GAD-7 and PHQ 9  ASSESSMENT: Patient currently experiencing Adjustment disorder with mixed anxious and depressed mood. Patient may benefit from psychoeducation and brief therapeutic intervention.  PLAN: 1. Follow up with behavioral health clinician on : Two weeks 2. Behavioral recommendations:  -Go to DSS to apply for [place] Medicaid, prior to next medical appointment -Read educational material regarding coping with symptoms of anxiety and depression 3. Referral(s): Integrated Behavioral Health Services (In Clinic) 4. "From scale of 1-10, how likely are you to follow plan?": 8  Rae Lips, LCSWA   Depression screen [facility] 2/9 03/22/2017 02/10/2017 01/27/2017 11/29/2016 11/25/2016  Decreased  Interest 0 0 0 0 0  Down, Depressed, Hopeless 0 0 0 0 0  PHQ - 2 Score 0 0 0 0 0  Altered sleeping - 0 1 1 0  Tired, decreased energy - 0 Change in appetite - 0 0 0 0  Feeling bad or failure about yourself  - 0 0 0 0  Trouble concentrating - 0 0 0 0  Moving slowly or fidgety/restless - 0 0 0 0  Suicidal thoughts - 0 0 0 0  PHQ-9 Score - 0 GAD 7 : Generalized Anxiety Score 02/10/2017 01/27/2017 11/29/2016 11/25/2016  Nervous, Anxious, on Edge 0 0 1 0  Control/stop worrying 0 0 0 0  Worry too much - different things 0 0 0 1  Trouble relaxing 0 0 0 0  Restless 0 0 0 0  Easily annoyed or irritable 0 0 0 1  Afraid - awful might happen 0 0 0 0  Total GAD 7 Score 0 0 1 2

## 2017-03-24 NOTE — Patient Instructions (Addendum)
Please pick up your prescription for Procardia XL  twice daily at [facility]: [address] [place], [place], [place] [address]

## 2017-03-24 NOTE — Progress Notes (Signed)
   PRENATAL VISIT NOTE  Subjective:  Jaeliana Lococo is a 41 y.o. G1P0000 at [redacted]w[redacted]d being seen today for ongoing prenatal care.  She is currently monitored for the following issues for this high-risk pregnancy and has Diabetes (HCC); Essential hypertension; DJD (degenerative joint disease); Supervision of high-risk pregnancy; Hypertension in pregnancy; Diabetes in pregnancy; AMA (advanced maternal age) multigravida 35+, first trimester; Marijuana use; Hemorrhoids during pregnancy; and Placenta previa antepartum on her problem list.  Patient reports high stress. She is not taking her medication. Was seen in [facility] for severe hypertension and was prescribed procardia, but couldn't afford it.  Hasn't been checking blood sugars. Has a lot of anxiety. Sister is living with her, who has a history of IV drug use. Is concerned about exposure to Hep C and would like to be tested for it.   Contractions: Not present. Vag. Bleeding: None.  Movement: Present. Denies leaking of fluid.   The following portions of the patient's history were reviewed and updated as appropriate: allergies, current medications, past family history, past medical history, past social history, past surgical history and problem list. Problem list updated.  Objective:   Vitals:   03/24/17 1320  BP: (!) 174/100  Weight: 276 lb (125.2 kg)    Fetal Status: Fetal Heart Rate (bpm): 150   Movement: Present     General:  Alert, oriented and cooperative. Patient is in no acute distress.  Skin: Skin is warm and dry. No rash noted.   Cardiovascular: Normal heart rate noted  Respiratory: Normal respiratory effort, no problems with respiration noted  Abdomen: Soft, gravid, appropriate for gestational age. Pain/Pressure: Absent     Pelvic:  Cervical exam deferred        Extremities: Normal range of motion.  Edema: None  Mental Status: Normal mood and affect. Normal behavior. Normal judgment and thought content.   Assessment and Plan:    Pregnancy: G1P0000 at [redacted]w[redacted]d  1. Supervision of high risk pregnancy in second trimester FHT and FH normal  2. Diabetes mellitus of other type without complication, unspecified long term insulin use status (HCC) Pt to check blood sugars. Cannot afford Echo - although she has medicare, it's for another state. Pt to see Asher Muir to help her get situated with insurance.  3. Pre-existing essential hypertension during pregnancy in second trimester Procardia is #20 at [facility]. Pt to get prescription there. Medicare will help pt to afford medication  4. AMA (advanced maternal age) multigravida 35+, first trimester  Preterm labor symptoms and general obstetric precautions including but not limited to vaginal bleeding, contractions, leaking of fluid and fetal movement were reviewed in detail with the patient. Please refer to After Visit Summary for other counseling recommendations.  Return in about 2 weeks (around 04/07/2017).   Levie Heritage, DO

## 2017-03-25 LAB — HEPATITIS C ANTIBODY: Hep C Virus Ab: <0.1 s/co ratio (ref 0.0–0.9)

## 2017-03-29 ENCOUNTER — Encounter (HOSPITAL_COMMUNITY): Payer: Self-pay | Admitting: *Deleted

## 2017-03-29 ENCOUNTER — Inpatient Hospital Stay (HOSPITAL_COMMUNITY)
Admission: AD | Admit: 2017-03-29 | Discharge: 2017-03-29 | Disposition: A | Discharge status: Home / Self Care | Payer: Self-pay | Source: Ambulatory Visit | Attending: Family Medicine | Admitting: Family Medicine

## 2017-03-29 ENCOUNTER — Ambulatory Visit (HOSPITAL_COMMUNITY)
Admission: RE | Admit: 2017-03-29 | Discharge: 2017-03-29 | Disposition: A | Discharge status: Home / Self Care | Payer: Self-pay | Source: Ambulatory Visit | Attending: Obstetrics & Gynecology | Admitting: Obstetrics & Gynecology

## 2017-03-29 ENCOUNTER — Encounter (HOSPITAL_COMMUNITY): Payer: Self-pay

## 2017-03-29 DIAGNOSIS — Z3A30 30 weeks gestation of pregnancy: Secondary | ICD-10-CM | POA: Insufficient documentation

## 2017-03-29 DIAGNOSIS — G5600 Carpal tunnel syndrome, unspecified upper limb: Secondary | ICD-10-CM | POA: Insufficient documentation

## 2017-03-29 DIAGNOSIS — M1990 Unspecified osteoarthritis, unspecified site: Secondary | ICD-10-CM | POA: Insufficient documentation

## 2017-03-29 DIAGNOSIS — O99353 Diseases of the nervous system complicating pregnancy, third trimester: Secondary | ICD-10-CM | POA: Insufficient documentation

## 2017-03-29 DIAGNOSIS — E282 Polycystic ovarian syndrome: Secondary | ICD-10-CM | POA: Insufficient documentation

## 2017-03-29 DIAGNOSIS — O24119 Pre-existing diabetes mellitus, type 2, in pregnancy, unspecified trimester: Secondary | ICD-10-CM

## 2017-03-29 DIAGNOSIS — Z9114 Patient's other noncompliance with medication regimen: Secondary | ICD-10-CM | POA: Insufficient documentation

## 2017-03-29 DIAGNOSIS — O163 Unspecified maternal hypertension, third trimester: Secondary | ICD-10-CM | POA: Insufficient documentation

## 2017-03-29 DIAGNOSIS — O24113 Pre-existing diabetes mellitus, type 2, in pregnancy, third trimester: Secondary | ICD-10-CM | POA: Insufficient documentation

## 2017-03-29 DIAGNOSIS — O99283 Endocrine, nutritional and metabolic diseases complicating pregnancy, third trimester: Secondary | ICD-10-CM | POA: Insufficient documentation

## 2017-03-29 DIAGNOSIS — E119 Type 2 diabetes mellitus without complications: Secondary | ICD-10-CM | POA: Insufficient documentation

## 2017-03-29 DIAGNOSIS — Z8249 Family history of ischemic heart disease and other diseases of the circulatory system: Secondary | ICD-10-CM | POA: Insufficient documentation

## 2017-03-29 DIAGNOSIS — O4400 Placenta previa specified as without hemorrhage, unspecified trimester: Secondary | ICD-10-CM

## 2017-03-29 DIAGNOSIS — O10012 Pre-existing essential hypertension complicating pregnancy, second trimester: Secondary | ICD-10-CM

## 2017-03-29 DIAGNOSIS — O10013 Pre-existing essential hypertension complicating pregnancy, third trimester: Secondary | ICD-10-CM | POA: Insufficient documentation

## 2017-03-29 DIAGNOSIS — O4403 Placenta previa specified as without hemorrhage, third trimester: Secondary | ICD-10-CM | POA: Insufficient documentation

## 2017-03-29 DIAGNOSIS — O10919 Unspecified pre-existing hypertension complicating pregnancy, unspecified trimester: Secondary | ICD-10-CM

## 2017-03-29 DIAGNOSIS — I10 Essential (primary) hypertension: Secondary | ICD-10-CM | POA: Insufficient documentation

## 2017-03-29 DIAGNOSIS — Z888 Allergy status to other drugs, medicaments and biological substances status: Secondary | ICD-10-CM | POA: Insufficient documentation

## 2017-03-29 DIAGNOSIS — O09513 Supervision of elderly primigravida, third trimester: Secondary | ICD-10-CM | POA: Insufficient documentation

## 2017-03-29 DIAGNOSIS — Z833 Family history of diabetes mellitus: Secondary | ICD-10-CM | POA: Insufficient documentation

## 2017-03-29 DIAGNOSIS — I160 Hypertensive urgency: Secondary | ICD-10-CM

## 2017-03-29 DIAGNOSIS — O24112 Pre-existing diabetes mellitus, type 2, in pregnancy, second trimester: Secondary | ICD-10-CM

## 2017-03-29 DIAGNOSIS — E669 Obesity, unspecified: Secondary | ICD-10-CM | POA: Insufficient documentation

## 2017-03-29 DIAGNOSIS — Z809 Family history of malignant neoplasm, unspecified: Secondary | ICD-10-CM | POA: Insufficient documentation

## 2017-03-29 DIAGNOSIS — O0992 Supervision of high risk pregnancy, unspecified, second trimester: Secondary | ICD-10-CM

## 2017-03-29 LAB — COMPREHENSIVE METABOLIC PANEL
ALBUMIN: 3 g/dL — AB (ref 3.5–5.0)
ALK PHOS: 63 U/L (ref 38–126)
ALT: 13 U/L — AB (ref 14–54)
AST: 14 U/L — AB (ref 15–41)
Anion gap: 8 (ref 5–15)
BILIRUBIN TOTAL: 0.8 mg/dL (ref 0.3–1.2)
BUN: 9 mg/dL (ref 6–20)
CO2: 24 mmol/L (ref 22–32)
CREATININE: 0.58 mg/dL (ref 0.44–1.00)
Calcium: 8.7 mg/dL — ABNORMAL LOW (ref 8.9–10.3)
Chloride: 104 mmol/L (ref 101–111)
GFR calc Af Amer: >60 mL/min (ref >60)
GLUCOSE: 134 mg/dL — AB (ref 65–99)
POTASSIUM: 4.2 mmol/L (ref 3.5–5.1)
Sodium: 136 mmol/L (ref 135–145)
TOTAL PROTEIN: 6.9 g/dL (ref 6.5–8.1)

## 2017-03-29 LAB — CBC
HEMATOCRIT: 33 % — AB (ref 36.0–46.0)
Hemoglobin: 11.5 g/dL — ABNORMAL LOW (ref 12.0–15.0)
MCH: 29.1 pg (ref 26.0–34.0)
MCHC: 34.8 g/dL (ref 30.0–36.0)
MCV: 83.5 fL (ref 78.0–100.0)
PLATELETS: 262 10*3/uL (ref 150–400)
RBC: 3.95 MIL/uL (ref 3.87–5.11)
RDW: 13.2 % (ref 11.5–15.5)
WBC: 8.1 10*3/uL (ref 4.0–10.5)

## 2017-03-29 LAB — PROTEIN / CREATININE RATIO, URINE
Creatinine, Urine: 129 mg/dL
PROTEIN CREATININE RATIO: 0.08 mg/mg{creat} (ref 0.00–0.15)
TOTAL PROTEIN, URINE: 10 mg/dL

## 2017-03-29 MED ORDER — NIFEDIPINE 10 MG PO CAPS
20.0000 mg | ORAL_CAPSULE | Freq: Once | ORAL | Status: AC
Start: 2017-03-29 — End: 2017-03-29
  Administered 2017-03-29: 20 mg via ORAL
  Filled 2017-03-29: qty 2

## 2017-03-29 MED ORDER — NIFEDIPINE 10 MG PO CAPS
10.0000 mg | ORAL_CAPSULE | Freq: Once | ORAL | Status: AC
  Administered 2017-03-29: 10 mg via ORAL
  Filled 2017-03-29: qty 1

## 2017-03-29 MED ORDER — NIFEDIPINE ER OSMOTIC RELEASE 30 MG PO TB24
30.0000 mg | ORAL_TABLET | Freq: Every day | ORAL | Status: DC
  Administered 2017-03-29: 30 mg via ORAL
  Filled 2017-03-29: qty 1

## 2017-03-29 NOTE — [facility] Note (Signed)
Pt sent from [facility] with elevated BP. Pt vomiting when RN went to get her from waiting room. Pt states that she is nauseated. Denies headache or visual changes.

## 2017-03-29 NOTE — Discharge Instructions (Signed)
Hypertension During Pregnancy Hypertension is also called high blood pressure. High blood pressure means that the force of your blood moving in your body is too strong. When you are pregnant, this condition should be watched carefully. It can cause problems for you and your baby. Follow these instructions at home: Eating and drinking   Drink enough fluid to keep your pee (urine) clear or pale yellow.  Eat healthy foods that are low in salt (sodium).  Do not add salt to your food.  Check labels on foods and drinks to see much salt is in them. Look on the label where you see "Sodium." Lifestyle   Do not use any products that contain nicotine or tobacco, such as cigarettes and e-cigarettes. If you need help quitting, ask your doctor.  Do not use alcohol.  Avoid caffeine.  Avoid stress. Rest and get plenty of sleep. General instructions   Take over-the-counter and prescription medicines only as told by your doctor.  While lying down, lie on your left side. This keeps pressure off your baby.  While sitting or lying down, raise (elevate) your feet. Try putting some pillows under your lower legs.  Exercise regularly. Ask your doctor what kinds of exercise are best for you.  Keep all prenatal and follow-up visits as told by your doctor. This is important. Contact a doctor if:  You have symptoms that your doctor told you to watch for, such as:  Fever.  Throwing up (vomiting).  Headache. Get help right away if:  You have very bad pain in your belly (abdomen).  You are throwing up, and this does not get better with treatment.  You suddenly get swelling in your hands, ankles, or face.  You gain 4 lb (1.8 kg) or more in 1 week.  You get bleeding from your vagina.  You have blood in your pee.  You do not feel your baby moving as much as normal.  You have a change in vision.  You have muscle twitching or sudden tightening (spasms).  You have trouble breathing.  Your  lips or fingernails turn blue. This information is not intended to replace advice given to you by your health care provider. Make sure you discuss any questions you have with your health care provider. Document Released: 01/08/2011 Document Revised: 08/17/2016 Document Reviewed: 08/17/2016 [employer] Interactive Patient Education  2017 [employer]   Preeclampsia and Eclampsia Preeclampsia is a serious condition that develops only during pregnancy. It is also called toxemia of pregnancy. This condition causes high blood pressure along with other symptoms, such as swelling and headaches. These symptoms may develop as the condition gets worse. Preeclampsia may occur at 20 weeks of pregnancy or later. Diagnosing and treating preeclampsia early is very important. If not treated early, it can cause serious problems for you and your baby. One problem it can lead to is eclampsia, which is a condition that causes muscle jerking or shaking (convulsions or seizures) in the mother. Delivering your baby is the best treatment for preeclampsia or eclampsia. Preeclampsia and eclampsia symptoms usually go away after your baby is born. What are the causes? The cause of preeclampsia is not known. What increases the risk? The following risk factors make you more likely to develop preeclampsia:  Being pregnant for the first time.  Having had preeclampsia during a past pregnancy.  Having a family history of preeclampsia.  Having high blood pressure.  Being pregnant with twins or triplets.  Being 81 or older.  Being African-American.  Having kidney disease or diabetes.  Having medical conditions such as lupus or blood diseases.  Being very overweight (obese). What are the signs or symptoms? The earliest signs of preeclampsia are:  High blood pressure.  Increased protein in your urine. Your health care provider will check for this at every visit before you give birth (prenatal visit). Other symptoms  that may develop as the condition gets worse include:  Severe headaches.  Sudden weight gain.  Swelling of the hands, face, legs, and feet.  Nausea and vomiting.  Vision problems, such as blurred or double vision.  Numbness in the face, arms, legs, and feet.  Urinating less than usual.  Dizziness.  Slurred speech.  Abdominal pain, especially upper abdominal pain.  Convulsions or seizures. Symptoms generally go away after giving birth. How is this diagnosed? There are no screening tests for preeclampsia. Your health care provider will ask you about symptoms and check for signs of preeclampsia during your prenatal visits. You may also have tests that include:  Urine tests.  Blood tests.  Checking your blood pressure.  Monitoring your babys heart rate.  Ultrasound. How is this treated? You and your health care provider will determine the treatment approach that is best for you. Treatment may include:  Having more frequent prenatal exams to check for signs of preeclampsia, if you have an increased risk for preeclampsia.  Bed rest.  Reducing how much salt (sodium) you eat.  Medicine to lower your blood pressure.  Staying in the hospital, if your condition is severe. There, treatment will focus on controlling your blood pressure and the amount of fluids in your body (fluid retention).  You may need to take medicine (magnesium sulfate) to prevent seizures. This medicine may be given as an injection or through an IV tube.  Delivering your baby early, if your condition gets worse. You may have your labor started with medicine (induced), or you may have a cesarean delivery. Follow these instructions at home: Eating and drinking    Drink enough fluid to keep your urine clear or pale yellow.  Eat a healthy diet that is low in sodium. Do not add salt to your food. Check nutrition labels to see how much sodium a food or beverage contains.  Avoid caffeine. Lifestyle    Do not use any products that contain nicotine or tobacco, such as cigarettes and e-cigarettes. If you need help quitting, ask your health care provider.  Do not use alcohol or drugs.  Avoid stress as much as possible. Rest and get plenty of sleep. General instructions   Take over-the-counter and prescription medicines only as told by your health care provider.  When lying down, lie on your side. This keeps pressure off of your baby.  When sitting or lying down, raise (elevate) your feet. Try putting some pillows underneath your lower legs.  Exercise regularly. Ask your health care provider what kinds of exercise are best for you.  Keep all follow-up and prenatal visits as told by your health care provider. This is important. How is this prevented? To prevent preeclampsia or eclampsia from developing during another pregnancy:  Get proper medical care during pregnancy. Your health care provider may be able to prevent preeclampsia or diagnose and treat it early.  Your health care provider may have you take a low-dose aspirin or a calcium supplement during your next pregnancy.  You may have tests of your blood pressure and kidney function after giving birth.  Maintain a healthy weight. Ask your  health care provider for help managing weight gain during pregnancy.  Work with your health care provider to manage any long-term (chronic) health conditions you have, such as diabetes or kidney problems. Contact a health care provider if:  You gain more weight than expected.  You have headaches.  You have nausea or vomiting.  You have abdominal pain.  You feel dizzy or light-headed. Get help right away if:  You develop sudden or severe swelling anywhere in your body. This usually happens in the legs.  You gain 5 lbs (2.3 kg) or more during one week.  You have severe:  Abdominal pain.  Headaches.  Dizziness.  Vision problems.  Confusion.  Nausea or vomiting.  You have  a seizure.  You have trouble moving any part of your body.  You develop numbness in any part of your body.  You have trouble speaking.  You have any abnormal bleeding.  You pass out. This information is not intended to replace advice given to you by your health care provider. Make sure you discuss any questions you have with your health care provider. Document Released: 12/03/2000 Document Revised: 08/03/2016 Document Reviewed: 07/12/2016 [employer] Interactive Patient Education  2017 [employer]

## 2017-03-29 NOTE — MAU Provider Note (Signed)
Chief Complaint:  Hypertension   First Provider Initiated Contact with Patient 03/29/17 [phone]      HPI: Latoya Strickland is a 41 y.o. G1P0000 at 59w5dwho presents to maternity admissions reporting sent in from MFM for high blood pressures.  Patient was being seen today in MFM for her fetal growth [place] While in MFM, she had severe range blood pressures, 200s/110s. Patient was sent to MAU for evaluation and BP management. She denies any headaches, vision changes, CP/SOB, RUQ/epigastric pain.   Patient is a known chronic hypertensive who has not been controlled and frequently is baseline in the severe range BPs, due to non-compliance with her medications. She has been counseled previously multiple times regarding the risks of having uncontrolled BP, including but not limited to: stroke, IUFD, placental abruption, end-organ damage, and even death. Patient understands, and states she was having difficulty with getting her prescriptions due to cost, despite having medicaid. She states she has gotten things straightened out with her insurance and cost of meds, and was planning on picking them up today. While in the room, the RN heard the FOB on the phone say, "don't taken any medications, it's poison to the baby. We don't need to treat the baby with blood pressure medication, or the baby will come out with high blood pressure, the baby is healthy right now".   Denies contractions, leakage of fluid or vaginal bleeding. Good fetal movement.   Pregnancy Course:   Past Medical History: Past Medical History:  Diagnosis Date  . Arthritis   . Blood transfusion without reported diagnosis   . Carpal tunnel syndrome   . Degenerative joint disease   . [facility] [place] without complication [place]   . DJD (degenerative joint disease)   . Hypertension   . Lumbar herniated disc   . PCOS (polycystic ovarian syndrome)     Past obstetric history: OB History  Gravida Para Term Preterm AB Living  1 0 0 0 0 0  SAB  TAB Ectopic Multiple Live Births  0 0 0 0 0    # Outcome Date GA Lbr Len/2nd Weight Sex Delivery Anes PTL Lv  1 Current               Past Surgical History: Past Surgical History:  Procedure Laterality Date  . WISDOM TOOTH EXTRACTION       Family History: Family History  Problem Relation Age of Onset  . Hypertension Mother   . Heart disease Mother   . Varicose Veins Mother   . Cancer Father   . Diabetes Father   . Diabetes Paternal Grandmother     Social History: Social History  Substance Use Topics  . Smoking status: Never Smoker  . Smokeless tobacco: Never Used  . Alcohol use No    Allergies:  Allergies  Allergen Reactions  . Lyrica [Pregabalin] Other (See Comments)    Hair loss   . Ultracet [Tramadol-Acetaminophen] Other (See Comments)    Stomach burning   . Ultram [Tramadol] Other (See Comments)    Stomach burning     Meds:  Prescriptions Prior to Admission  Medication Sig Dispense Refill Last Dose  . Prenatal MV-Min-FA-Omega-3 (PRENATAL GUMMIES/DHA & FA PO) Take 1.25 tablets by mouth daily.   03/29/2017 at Unknown time  . Blood Glucose Monitoring Suppl (ACCU-CHEK NANO SMARTVIEW) w/Device KIT 1 kit by Subdermal route as directed. Check blood sugars for fasting, and two hours after breakfast, lunch and dinner (4 checks daily) (Patient not taking: Reported on 03/29/2017)  1 kit 0 Not Taking  . glyBURIDE (DIABETA) 2.5 MG tablet Take 1 tablet (2.5 mg total) by mouth 2 (two) times daily with a meal. (Patient not taking: Reported on 03/24/2017) 60 tablet 3 Not Taking at Unknown time  . NIFEdipine (PROCARDIA-XL/ADALAT-CC/NIFEDICAL-XL) 30 MG 24 hr tablet Take 1 tablet (30 mg total) by mouth 2 (two) times daily. (Patient not taking: Reported on 03/29/2017) 60 tablet 3 Not Taking at Unknown time    I have reviewed patient's Past Medical Hx, Surgical Hx, Family Hx, Social Hx, medications and allergies.   ROS:  A comprehensive ROS was negative except per HPI.     Physical Exam   Patient Vitals for the past 24 hrs:  BP Temp Pulse Resp  03/29/17 1300 (!) 172/93 - 84 -  03/29/17 1231 (!) 175/95 - 87 -  03/29/17 1220 (!) 177/88 - 84 -  03/29/17 1147 (!) 193/98 - 78 -  03/29/17 1132 (!) 181/96 - 76 -  03/29/17 1117 (!) 180/96 - 81 -  03/29/17 1102 (!) 181/93 - 82 -  03/29/17 1047 (!) 169/88 - 88 -  03/29/17 1032 (!) 192/118 - 84 -  03/29/17 1030 - - 84 -  03/29/17 1028 (!) 200/95 - 83 -  03/29/17 1015 (!) 182/98 - 84 -  03/29/17 0955 (!) 219/112 - 64 -  03/29/17 0930 (!) 207/107 98.4 F (36.9 C) 64 18   Constitutional: Well-developed, well-nourished morbidly obese female in no acute distress.  Cardiovascular: normal rate, rhythm, no murmurs Respiratory: normal effort, CTAB GI: Abd soft, non-tender, gravid appropriate for gestational age. Pos BS x 4 MS: Extremities nontender,+1 pitting edema b/l LE to mid-calf, normal ROM Neurologic: Alert and oriented x 4. Patellar reflexes 2/4 bilaterally. No clonus.    Labs: Results for orders placed or performed during the hospital encounter of 03/29/17 (from the past 24 hour(s))  Protein / creatinine ratio, urine     Status: None   Collection Time: 03/29/17  9:10 AM  Result Value Ref Range   Creatinine, Urine 129.00 mg/dL   Total Protein, Urine 10 mg/dL   Protein Creatinine Ratio 0.08 0.00 - 0.15 mg/mg[Cre]  Comprehensive metabolic panel     Status: Abnormal   Collection Time: 03/29/17  9:22 AM  Result Value Ref Range   Sodium 136 135 - 145 mmol/L   Potassium 4.2 3.5 - 5.1 mmol/L   Chloride 104 101 - 111 mmol/L   CO2 24 22 - 32 mmol/L   Glucose, Bld 134 (H) 65 - 99 mg/dL   BUN 9 6 - 20 mg/dL   Creatinine, Ser 0.58 0.44 - 1.00 mg/dL   Calcium 8.7 (L) 8.9 - 10.3 mg/dL   Total Protein 6.9 6.5 - 8.1 g/dL   Albumin 3.0 (L) 3.5 - 5.0 g/dL   AST 14 (L) 15 - 41 U/L   ALT 13 (L) 14 - 54 U/L   Alkaline Phosphatase 63 38 - 126 U/L   Total Bilirubin 0.8 0.3 - 1.2 mg/dL   GFR calc non Af Amer >60  >60 mL/min   GFR calc Af Amer >60 >60 mL/min   Anion gap 8 5 - 15  CBC     Status: Abnormal   Collection Time: 03/29/17  9:22 AM  Result Value Ref Range   WBC 8.1 4.0 - 10.5 K/uL   RBC 3.95 3.87 - 5.11 MIL/uL   Hemoglobin 11.5 (L) 12.0 - 15.0 g/dL   HCT 33.0 (L) 36.0 - 46.0 %   MCV  83.5 78.0 - 100.0 fL   MCH 29.1 26.0 - 34.0 pg   MCHC 34.8 30.0 - 36.0 g/dL   RDW 13.2 11.5 - 15.5 %   Platelets 262 150 - 400 K/uL    Imaging:  [place] Mfm Ob Follow Up  Result Date: 03/29/2017 ----------------------------------------------------------------------  OBSTETRICS REPORT                      (Signed Final 03/29/2017 09:26 am) ---------------------------------------------------------------------- Patient Info  ID #:       [id number]                         D.O.B.:   1976/11/27 (40 yrs)  Name:       Ladon Applebaum                 Visit Date:  03/29/2017 07:49 am ---------------------------------------------------------------------- Performed By  Performed By:     Valda Favia          Ref. Address:     [address]                                                             [place], [place]  Attending:        Wende Mott MD     Location:         [facility]  Referred By:      Guss Bunde MD ---------------------------------------------------------------------- Orders   #  Description                                 Code   1  [place] MFM OB FOLLOW UP                         [id number]  ----------------------------------------------------------------------   #  Ordered By               Order #        Accession #    Episode #   1  Benjaman Lobe            [id number]      [id number]     [id number]  ---------------------------------------------------------------------- Indications   [redacted] weeks gestation of pregnancy  Z3A.30    Hypertension - Chronic/Pre-existing            O10.019   (Procardia-refuses to take)   Diabetes - Pregestational,3rd trimester        O24.313   (glyburide-refuses to take)(A1c 11/27   7.0)(missed fetal echo appt 4/6)   Advanced maternal age primigravida 50+,        O17.513   third trimester, declined testing   Obesity complicating pregnancy, third          O99.213   trimester  ---------------------------------------------------------------------- OB History  Blood Type:            Height:  5'10"  Weight (lb):  261      BMI:   37.45  Gravidity:    1         Term:   0        Prem:   0        SAB:   0  TOP:          0       Ectopic:  0 ---------------------------------------------------------------------- Fetal Evaluation  Num Of Fetuses:     1  Fetal Heart         141  Rate(bpm):  Cardiac Activity:   Observed  Presentation:       Cephalic  Placenta:           Posterior, above cervical os  P. Cord Insertion:  Previously Visualized  Amniotic Fluid  AFI FV:      Subjectively within normal limits  AFI Sum(cm)     %Tile       Largest Pocket(cm)  10.86           21          4  RUQ(cm)       RLQ(cm)       LUQ(cm)        LLQ(cm)  3.47          0             4              3.39 ---------------------------------------------------------------------- Biometry  BPD:      74.9  mm     G. Age:  30w 0d         20  %    CI:        68.81   %   70 - 86                                                          FL/HC:      20.5   %   19.3 - 21.3  HC:      288.5  mm     G. Age:  31w 5d         43  %    HC/AC:      0.95       0.96 - 1.17  AC:      302.3  mm     G. Age:  34w 2d       > 97  %    FL/BPD:     79.0   %   71 - 87  FL:       59.2  mm  G. Age:  30w 6d         40  %    FL/AC:      19.6   %   20 - 24  HUM:        54  mm     G. Age:  31w 3d         42  %  Est. FW:    2011  gm      4 lb 7 oz     82  % ---------------------------------------------------------------------- Gestational Age  U/S Today:     31w 5d                                         EDD:   05/26/17  Best:          30w 5d    Det. By:   Previous Ultrasound      EDD:   06/02/17                                      (10/07/16) ---------------------------------------------------------------------- Anatomy  Cranium:               Appears normal         Aortic Arch:            Previously seen  Cavum:                 Previously seen        Ductal Arch:            Previously seen  Ventricles:            Previously seen        Diaphragm:              Previously seen  Choroid Plexus:        Previously seen        Stomach:                Appears normal, left                                                                        sided  Cerebellum:            Previously seen        Abdomen:                Previously seen  Posterior Fossa:       Previously seen        Abdominal Wall:         Previously seen  Nuchal Fold:           Previously seen        Cord Vessels:           Previously seen  Face:                  Orbits and profile     Kidneys:                Appear normal  previously seen  Lips:                  Previously seen        Bladder:                Appears normal  Thoracic:              Appears normal         Spine:                  Previously seen  Heart:                 Appears normal         Upper Extremities:      Previously seen                         (4CH, axis, and situs  RVOT:                  Previously seen        Lower Extremities:      Previously seen  LVOT:                  Previously seen  Other:  Fetus appears to be a female. Heels and 5th digit previously visualized.          Nasal bone previously visualized. Open hands previously visualized. ---------------------------------------------------------------------- Cervix Uterus Adnexa  Cervix  Not visualized (advanced GA >29wks)  Uterus  No abnormality visualized.  Left Ovary  No adnexal mass visualized.  Right Ovary  No adnexal mass visualized.  Cul De Sac:   No free fluid seen.  Adnexa:       No  abnormality visualized. ---------------------------------------------------------------------- Impression  Indication: 41 yr old G1P0 at 60w5dwith type II diabetes and  chronic hypertension for fetal growth.  Findings:  1. Single intrauterine pregnancy.  2. Estimated fetal weight is in the 82nd%.  3. Posterior placenta without evidence of previa.  4. Normal amniotic fluid index.  5. The limited anatomy survey is normal. ---------------------------------------------------------------------- Recommendations  1. Appropriate fetal growth.  2. Hypertension:  - patient has been noncompliant and has not taken her  prescribed procardia  - patient has had severely elevated BPs for the entire  pregnancy  - BP today is 205/125; 206/119  - I explained to the patient the risk of stroke, IUFD, abruption,  eclampsia, and death with these uncontrolled blood pressures  - I recommended the patient go to MAU for immediate  treatment and lowering of blood pressure and evaluation for  preeclampsia- patient was in agreement and went to MAU  - I reiterated importance of compliance with the patient and  risks with noncompliance  - fetal growth every 4 weeks  - start antenatal testing at 32 weeks  - delivery timing based on clinical scenario  - monitor closely for signs/symptoms of preeclampsia  2. Diabetes:  - previously counseled  - noncompliant with glyburide  - patient declined fetal echocardiogram  - fetal surveillance as above  3. Advanced maternal age:  - previously counseled  - declined aneuploidy screening  I spoke with Dr. SNehemiah Settleto let him know about the blood  pressures and my recommendations for patient to go to MAU.  Please consult [email or web address] if further recommendations are  desired. ----------------------------------------------------------------------                KWende Mott MD Electronically  Signed Final Report   03/29/2017 09:26 am ----------------------------------------------------------------------  [place] Mfm Ob  Follow Up  Result Date: 03/01/2017 ----------------------------------------------------------------------  OBSTETRICS REPORT                      (Signed Final 03/01/2017 05:12 pm) ---------------------------------------------------------------------- Patient Info  ID #:       [id number]                         D.O.B.:   1976/07/25 (40 yrs)  Name:       Ladon Applebaum                 Visit Date:  03/01/2017 04:31 pm ---------------------------------------------------------------------- Performed By  Performed By:     Berlinda Last          Ref. Address:     [address]                                                             [place], [place]  Attending:        Silvestre Moment [place]        Location:         [facility]                    MD  Referred By:      Guss Bunde MD ---------------------------------------------------------------------- Orders   #  Description                                 Code   1  [place] MFM OB FOLLOW UP                         [id number]  ----------------------------------------------------------------------   #  Ordered By               Order #        Accession #    Episode #   1  Benjaman Lobe            [id number]      [id number]     [id number]  ---------------------------------------------------------------------- Indications   [redacted] weeks gestation of pregnancy  Z3A.26   Hypertension - Chronic/Pre-existing            O10.019   (Procardia-refuses to take)   Diabetes - Pregestational, 2nd trimester       O24.312   (glyburide- refuses to take) (A1c 11/27 7.0)   (missed fetal echo appointment)   Advanced maternal age primigravida 63+,        O36.512   second trimester; declined testing   Obesity complicating pregnancy, second         O99.212   trimester   ---------------------------------------------------------------------- OB History  Blood Type:            Height:  5'10"  Weight (lb):  261      BMI:   37.45  Gravidity:    1         Term:   0        Prem:   0        SAB:   0  TOP:          0       Ectopic:  0 ---------------------------------------------------------------------- Fetal Evaluation  Num Of Fetuses:     1  Fetal Heart         145  Rate(bpm):  Cardiac Activity:   Observed  Presentation:       Cephalic  Placenta:           Posterior, above cervical os  P. Cord Insertion:  Previously Visualized  Amniotic Fluid  AFI FV:      Subjectively within normal limits                              Largest Pocket(cm)                              5.6 ---------------------------------------------------------------------- Biometry  BPD:      69.7  mm     G. Age:  28w 0d         81  %    CI:        73.51   %   70 - 86                                                          FL/HC:      20.8   %   18.6 - 20.4  HC:      258.3  mm     G. Age:  28w 0d         68  %    HC/AC:      1.02       1.05 - 1.21  AC:      252.3  mm     G. Age:  29w 3d       > 97  %    FL/BPD:     77.2   %   71 - 87  FL:       53.8  mm     G. Age:  28w 4d         84  %    FL/AC:      21.3   %   20 - 24  Est. FW:  1300  gm    2 lb 14 oz      84  % ---------------------------------------------------------------------- Gestational Age  U/S Today:     28w 4d                                        EDD:   05/20/17  Best:          26w 5d    Det. By:   Previous Ultrasound      EDD:   06/02/17                                      (10/07/16) ---------------------------------------------------------------------- Anatomy  Cranium:               Appears normal         Aortic Arch:            Previously seen  Cavum:                 Previously seen        Ductal Arch:            Appears normal  Ventricles:            Appears normal         Diaphragm:              Appears normal  Choroid Plexus:        Previously  seen        Stomach:                Appears normal, left                                                                        sided  Cerebellum:            Previously seen        Abdomen:                Appears normal  Posterior Fossa:       Previously seen        Abdominal Wall:         Previously seen  Nuchal Fold:           Previously seen        Cord Vessels:           Previously seen  Face:                  Orbits and profile     Kidneys:                Appear normal                         previously seen  Lips:                  Previously seen        Bladder:                Appears normal  Thoracic:              Appears normal         Spine:                  Previously seen  Heart:                 Previously seen        Upper Extremities:      Previously seen  RVOT:                  Appears normal         Lower Extremities:      Previously seen  LVOT:                  Previously seen  Other:  Fetus appears to be a female. Heels and 5th digit previously visualized.          Nasal bone previously visualized. Open hands previously visualized. ---------------------------------------------------------------------- Cervix Uterus Adnexa  Cervix  Length:            3.1  cm.  Normal appearance by transabdominal scan. ---------------------------------------------------------------------- Impression  Single IUP at 26w 5d  Pregestational diabetes- has not completed echo yet  Normal interval anatomy  Fetal growth is appropriate (84th %tile)  Posterior placenta without previa  Normal amniotic fluid volume ---------------------------------------------------------------------- Recommendations  Fetal echo rescheduled  Ultrasound for growth in 4 weeks  Antenatal testing beginning no later than [redacted] weeks gestation ----------------------------------------------------------------------                Kerry Kass, MD Electronically Signed Final Report   03/01/2017 05:12 pm  ----------------------------------------------------------------------   MAU Course: Procardia 10 mg immediate release x2 Procardia 30 XL given for today's dose, to pick up Rx and take second dose tonight CBC - normal CMP - Normal baseline Cr 0.58; AST/ALT normal PC ratio 0.08  I personally reviewed the patient's NST today, found to be REACTIVE. 140 bpm, mod var, +accels, no decels. CTX: None.  Spoke with Dr. Nehemiah Settle regarding NST, variable decels seen (2 early in NST), one with position changes, rest of NST was reactive. AFI 10cm. With majority of NST reactive (1 hour prior to discharge) and normal AFI, modified BPP is reactive. Permissive HTN of 170s/90s (improved from 200s/110s), with BP meds to be taken today after discharge. OK to d/c home.  MDM: Plan of care reviewed with patient, including labs and tests ordered and medical treatment. Goal BP was to decrease BP slowly down, OK for discharge in 160-180/80-90 (due to baseline has been > 200s/100s).  Plan to take BP meds tonight and follow up in office. Ruled out preeclampsia today, severe range BPs are due to uncontrolled chronic hypertension. She has no signs of end organ damage. Discussed signs/symptoms of preeclampsia, and to return to the hospital/MAU immediately upon onset of symptoms. Encouraged importance again of improved BP control, and compliance with medication for her health and baby's health (please see HPI).   Patient also asked if she could travel to [place] today. My response to the patient was that in my medical opinion, I would not recommend this, due to patient's medical condition, and that she needs follow up in the office in 1-2 days for repeat BP check. Discussed the risks again with the patient regarding the high blood pressure, and encouraged that as soon as she leaves MAU, to go pick up her prescription and take a dose this evening (  will be taking BID). Discussed the signs and symptoms of preeclampsia with the  patient, and to return to MAU/hospital immediately with any onset of these symptoms.  Assessment: 1. Placenta previa antepartum   2. Supervision of high risk pregnancy in second trimester   3. Pre-existing essential hypertension during pregnancy in second trimester   4. Pre-existing type 2 diabetes mellitus during pregnancy in second trimester     Plan: Discharge home in stable condition.  Preterm Labor precautions and fetal kick counts Preeclampsia precautions given Encouraged to pick up Rx today for BP and to take her next dose tonight, and will be taking BID. Will eventually need to increase dose, likely after repeat BP check in office in 1-2 days. Recommended patient not leave for [place] today.    Allergies as of 03/29/2017      Reactions   Lyrica [pregabalin] Other (See Comments)   Hair loss   Ultracet [tramadol-acetaminophen] Other (See Comments)   Stomach burning   Ultram [tramadol] Other (See Comments)   Stomach burning      Medication List    TAKE these medications   ACCU-CHEK NANO SMARTVIEW w/Device Kit 1 kit by Subdermal route as directed. Check blood sugars for fasting, and two hours after breakfast, lunch and dinner (4 checks daily)   glyBURIDE 2.5 MG tablet Commonly known as:  DIABETA Take 1 tablet (2.5 mg total) by mouth 2 (two) times daily with a meal.   NIFEdipine 30 MG 24 hr tablet Commonly known as:  PROCARDIA-XL/ADALAT-CC/NIFEDICAL-XL Take 1 tablet (30 mg total) by mouth 2 (two) times daily.   PRENATAL GUMMIES/DHA & FA PO Take 1.25 tablets by mouth daily.       Katherine Basset, DO OB Fellow [facility] for [facility] 03/29/2017 10:27 AM

## 2017-03-30 ENCOUNTER — Telehealth: Payer: Self-pay | Admitting: General Practice

## 2017-03-30 NOTE — Telephone Encounter (Signed)
Per Dr Omer Jack, patient needs to come in today 4/11 or tomorrow 4/12 for BP check. Patient was recently seen in MAU. Called patient, no answer- left message stating we are trying to reach you regarding an appt for today or tomorrow, please call us back

## 2017-03-31 NOTE — Telephone Encounter (Signed)
Attempted to call patient. There was no answer. VM left stating I am calling regarding an appointment, please return my call at the clinic.

## 2017-04-07 ENCOUNTER — Ambulatory Visit (INDEPENDENT_AMBULATORY_CARE_PROVIDER_SITE_OTHER): Payer: Self-pay | Admitting: Family Medicine

## 2017-04-07 ENCOUNTER — Observation Stay (HOSPITAL_COMMUNITY)
Admission: AD | Admit: 2017-04-07 | Discharge: 2017-04-08 | Disposition: A | Discharge status: Home / Self Care | Payer: Self-pay | Source: Ambulatory Visit | Attending: Obstetrics and Gynecology | Admitting: Obstetrics and Gynecology

## 2017-04-07 ENCOUNTER — Encounter (HOSPITAL_COMMUNITY): Payer: Self-pay

## 2017-04-07 ENCOUNTER — Encounter: Payer: Self-pay | Admitting: Family Medicine

## 2017-04-07 VITALS — BP 221/117 | HR 74 | Wt 247.7 lb

## 2017-04-07 DIAGNOSIS — O24113 Pre-existing diabetes mellitus, type 2, in pregnancy, third trimester: Secondary | ICD-10-CM

## 2017-04-07 DIAGNOSIS — E119 Type 2 diabetes mellitus without complications: Secondary | ICD-10-CM | POA: Insufficient documentation

## 2017-04-07 DIAGNOSIS — O10919 Unspecified pre-existing hypertension complicating pregnancy, unspecified trimester: Secondary | ICD-10-CM | POA: Diagnosis present

## 2017-04-07 DIAGNOSIS — O10913 Unspecified pre-existing hypertension complicating pregnancy, third trimester: Principal | ICD-10-CM | POA: Insufficient documentation

## 2017-04-07 DIAGNOSIS — Z3A32 32 weeks gestation of pregnancy: Secondary | ICD-10-CM | POA: Insufficient documentation

## 2017-04-07 DIAGNOSIS — O09521 Supervision of elderly multigravida, first trimester: Secondary | ICD-10-CM

## 2017-04-07 DIAGNOSIS — O09523 Supervision of elderly multigravida, third trimester: Secondary | ICD-10-CM

## 2017-04-07 DIAGNOSIS — O10013 Pre-existing essential hypertension complicating pregnancy, third trimester: Secondary | ICD-10-CM

## 2017-04-07 DIAGNOSIS — O10012 Pre-existing essential hypertension complicating pregnancy, second trimester: Secondary | ICD-10-CM

## 2017-04-07 DIAGNOSIS — O0993 Supervision of high risk pregnancy, unspecified, third trimester: Secondary | ICD-10-CM

## 2017-04-07 LAB — CBC
HEMATOCRIT: 31.6 % — AB (ref 36.0–46.0)
HEMOGLOBIN: 11.1 g/dL — AB (ref 12.0–15.0)
MCH: 29.3 pg (ref 26.0–34.0)
MCHC: 35.1 g/dL (ref 30.0–36.0)
MCV: 83.4 fL (ref 78.0–100.0)
Platelets: 221 10*3/uL (ref 150–400)
RBC: 3.79 MIL/uL — ABNORMAL LOW (ref 3.87–5.11)
RDW: 13.5 % (ref 11.5–15.5)
WBC: 7.8 10*3/uL (ref 4.0–10.5)

## 2017-04-07 LAB — COMPREHENSIVE METABOLIC PANEL
ALBUMIN: 2.8 g/dL — AB (ref 3.5–5.0)
ALK PHOS: 66 U/L (ref 38–126)
ALT: 11 U/L — ABNORMAL LOW (ref 14–54)
ANION GAP: 7 (ref 5–15)
AST: 15 U/L (ref 15–41)
BILIRUBIN TOTAL: 1 mg/dL (ref 0.3–1.2)
BUN: 8 mg/dL (ref 6–20)
CALCIUM: 8.5 mg/dL — AB (ref 8.9–10.3)
CO2: 22 mmol/L (ref 22–32)
Chloride: 107 mmol/L (ref 101–111)
Creatinine, Ser: 0.72 mg/dL (ref 0.44–1.00)
GFR calc Af Amer: >60 mL/min (ref >60)
GFR calc non Af Amer: >60 mL/min (ref >60)
GLUCOSE: 100 mg/dL — AB (ref 65–99)
POTASSIUM: 4.2 mmol/L (ref 3.5–5.1)
SODIUM: 136 mmol/L (ref 135–145)
TOTAL PROTEIN: 6.1 g/dL — AB (ref 6.5–8.1)

## 2017-04-07 LAB — TYPE AND SCREEN
ABO/RH(D): A POS
Antibody Screen: NEGATIVE

## 2017-04-07 LAB — PROTEIN / CREATININE RATIO, URINE
Creatinine, Urine: 228 mg/dL
PROTEIN CREATININE RATIO: 0.1 mg/mg{creat} (ref 0.00–0.15)
TOTAL PROTEIN, URINE: 23 mg/dL

## 2017-04-07 LAB — ABO/RH: ABO/RH(D): A POS

## 2017-04-07 LAB — GLUCOSE, CAPILLARY: Glucose-Capillary: 152 mg/dL — ABNORMAL HIGH (ref 65–99)

## 2017-04-07 MED ORDER — LACTATED RINGERS IV SOLN
INTRAVENOUS | Status: DC
  Administered 2017-04-07: 16:00:00 via INTRAVENOUS

## 2017-04-07 MED ORDER — ZOLPIDEM TARTRATE 5 MG PO TABS: 5.0000 mg | ORAL_TABLET | Freq: Every evening | ORAL | Status: DC | PRN

## 2017-04-07 MED ORDER — LABETALOL HCL 5 MG/ML IV SOLN
20.0000 mg | INTRAVENOUS | Status: AC | PRN
  Administered 2017-04-07: 80 mg via INTRAVENOUS
  Administered 2017-04-07: 40 mg via INTRAVENOUS
  Administered 2017-04-07: 20 mg via INTRAVENOUS
  Filled 2017-04-07: qty 4
  Filled 2017-04-07: qty 16
  Filled 2017-04-07: qty 8

## 2017-04-07 MED ORDER — PRENATAL MULTIVITAMIN CH: 1.0000 | ORAL_TABLET | Freq: Every day | ORAL | Status: DC

## 2017-04-07 MED ORDER — LACTATED RINGERS IV SOLN: INTRAVENOUS | Status: DC

## 2017-04-07 MED ORDER — HYDRALAZINE HCL 20 MG/ML IJ SOLN
10.0000 mg | Freq: Once | INTRAMUSCULAR | Status: AC
  Administered 2017-04-07: 10 mg via INTRAVENOUS
  Filled 2017-04-07: qty 1

## 2017-04-07 MED ORDER — HYDRALAZINE HCL 20 MG/ML IJ SOLN
10.0000 mg | Freq: Once | INTRAMUSCULAR | Status: AC | PRN
  Administered 2017-04-07: 10 mg via INTRAVENOUS
  Filled 2017-04-07: qty 1

## 2017-04-07 MED ORDER — LABETALOL HCL 5 MG/ML IV SOLN
40.0000 mg | Freq: Once | INTRAVENOUS | Status: AC
  Administered 2017-04-07: 40 mg via INTRAVENOUS
  Filled 2017-04-07: qty 8

## 2017-04-07 MED ORDER — DOCUSATE SODIUM 100 MG PO CAPS: 100.0000 mg | ORAL_CAPSULE | Freq: Every day | ORAL | Status: DC

## 2017-04-07 MED ORDER — NIFEDIPINE ER OSMOTIC RELEASE 30 MG PO TB24
30.0000 mg | ORAL_TABLET | Freq: Two times a day (BID) | ORAL | Status: DC
  Administered 2017-04-07: 30 mg via ORAL
  Filled 2017-04-07: qty 1

## 2017-04-07 MED ORDER — ACETAMINOPHEN 325 MG PO TABS: 650.0000 mg | ORAL_TABLET | ORAL | Status: DC | PRN

## 2017-04-07 MED ORDER — CALCIUM CARBONATE ANTACID 500 MG PO CHEW: 2.0000 | CHEWABLE_TABLET | ORAL | Status: DC | PRN

## 2017-04-07 NOTE — MAU Note (Signed)
Urine in lab 

## 2017-04-07 NOTE — MAU Note (Signed)
Patient taken to direct admit bed 313. V/s charted; FHR 140's.

## 2017-04-07 NOTE — Progress Notes (Signed)
BP 162/103, pt refuses anymore BP meds

## 2017-04-07 NOTE — Progress Notes (Signed)
   PRENATAL VISIT NOTE  Subjective:  Latoya Strickland is a 41 y.o. G1P0000 at [redacted]w[redacted]d being seen today for ongoing prenatal care.  She is currently monitored for the following issues for this high-risk pregnancy and has Diabetes (HCC); Essential hypertension; DJD (degenerative joint disease); Supervision of high-risk pregnancy; Hypertension in pregnancy; Diabetes in pregnancy; AMA (advanced maternal age) multigravida 35+, first trimester; Marijuana use; and Hemorrhoids during pregnancy on her problem list.  Patient reports no problems. Has good fetal movement. Has not picked up procardia. Contractions: Not present. Vag. Bleeding: None.  Movement: Present. Denies leaking of fluid.   The following portions of the patient's history were reviewed and updated as appropriate: allergies, current medications, past family history, past medical history, past social history, past surgical history and problem list. Problem list updated.  Objective:   Vitals:   04/07/17 1358  BP: (!) 221/117  Pulse: 74  Weight: 247 lb 11.2 oz (112.4 kg)    Fetal Status: Fetal Heart Rate (bpm): 143   Movement: Present     General:  Alert, oriented and cooperative. Patient is in no acute distress.  Skin: Skin is warm and dry. No rash noted.   Cardiovascular: Normal heart rate noted  Respiratory: Normal respiratory effort, no problems with respiration noted  Abdomen: Soft, gravid, appropriate for gestational age. Pain/Pressure: Absent     Pelvic:  Cervical exam deferred        Extremities: Normal range of motion.  Edema: Trace  Mental Status: Normal mood and affect. Normal behavior. Normal judgment and thought content.   Assessment and Plan:  Pregnancy: G1P0000 at [redacted]w[redacted]d  1. Supervision of high risk pregnancy in third trimester FHT and FH normal  2. Pre-existing type 2 diabetes mellitus during pregnancy in third trimester Discussed checking blood sugar. She has been non-compliant with this. She also has not obtained  the fetal echo because she has not switched over her insurance to [facility] here and cannot afford out of pocket  3. AMA (advanced maternal age) multigravida 35+, first trimester  4. Pre-existing essential hypertension during pregnancy in second trimester Will directly admit as after 2 weeks the patient still has not obtained oral medication. Patient amenable to this.  Preterm labor symptoms and general obstetric precautions including but not limited to vaginal bleeding, contractions, leaking of fluid and fetal movement were reviewed in detail with the patient. Please refer to After Visit Summary for other counseling recommendations.  No Follow-up on file.   Levie Heritage, DO

## 2017-04-07 NOTE — Progress Notes (Signed)
20 mg labetalol given, MD at bedside

## 2017-04-07 NOTE — Progress Notes (Signed)
MD is aware of pt's refusal of blood pressure meds at this time for high blood pressure. Baby reactive on monitor.

## 2017-04-07 NOTE — H&P (Signed)
Latoya Strickland is a 41 y.o. female G1P0 IUP 32 0/7 weeks with known DM and CHTN. She has been very non complaint with her medication throughout this pregnancy. She was seen in the clinic today for routine OB visit and noted to have elevated BP 200's/110's. She denies any HA, blurry vision or RUQ pain.  As note she has been non complaint and has not taken any prescribed BP and DM medications during this pregnancy.  Growth scan on 03/29/17 revealed normal AFI, posterior placenta, Vtx, EFW 2011 gm, 82%.  She was ssen in the MAU as well on 03/29/17 with negative PEC work up.  OB History    Gravida Para Term Preterm AB Living   1 0 0 0 0 0   SAB TAB Ectopic Multiple Live Births   0 0 0 0 0     Past Medical History:  Diagnosis Date  . Arthritis   . Blood transfusion without reported diagnosis   . Carpal tunnel syndrome   . Degenerative joint disease   . Diabetes mellitus without complication (HCC)   . DJD (degenerative joint disease)   . Hypertension   . Lumbar herniated disc   . PCOS (polycystic ovarian syndrome)    Past Surgical History:  Procedure Laterality Date  . WISDOM TOOTH EXTRACTION     Family History: family history includes Cancer in her father; Diabetes in her father and paternal grandmother; Heart disease in her mother; Hypertension in her mother; Varicose Veins in her mother. Social History:  reports that she has never smoked. She has never used smokeless tobacco. She reports that she uses drugs, including Marijuana. She reports that she does not drink alcohol.     ROS History   Blood pressure (!) 199/106, pulse 71, temperature 98.8 F (37.1 C), temperature source Oral, resp. rate 18, height  (1.778 m), weight 112.4 kg (247 lb 11.2 oz), last menstrual period 08/13/2016, SpO2 100 %, unknown if currently breastfeeding. Exam Physical Exam  Constitutional: She is oriented to person, place, and time. She appears well-developed and well-nourished.  Cardiovascular:  Normal rate and regular rhythm.   Respiratory: Effort normal and breath sounds normal.  GI: Soft. Bowel sounds are normal.  gravid  Genitourinary:  Genitourinary Comments: deffered  Musculoskeletal:  Trace edema  Neurological: She is alert and oriented to person, place, and time. She has normal reflexes.    Prenatal labs: ABO, Rh: --/--/A POS (04/19 1552) Antibody: NEG (04/19 1552) Rubella: 2.76 (11/27 1123) RPR: NON REAC (11/27 1123)  HBsAg: NEGATIVE (11/27 1123)  HIV: NONREACTIVE (11/27 1123)  GBS:     Assessment/Plan: IUP 32 weeks CHTN DM Non compliance   Pt will be admitted for BP control. Continue with Dm medication. Check CBG's and PEC labs. Do not think PEC just uncontrolled HTN.  Pt was very hesitate to take any medication because of fear of harming baby.  Discussed with the pt the risks of uncontrolled BO and DM and risk of PEC with pt. This discussion has been held several other times with the pt as documented in her medical records.  She verbalized understanding of these risks. After some time she agreed to BP medication to control her BP and to continue with her DM medication and following her CBG's  I dicussed magnesium with her but she declined. Also discussed celestone for [facility] but she decline at presentMarina Boernero think about it.  Hermina Staggers 04/07/2017, 7:03 PM

## 2017-04-08 ENCOUNTER — Encounter (HOSPITAL_COMMUNITY): Payer: Self-pay | Admitting: Family Medicine

## 2017-04-08 LAB — HEMOGLOBIN A1C
HEMOGLOBIN A1C: 6.9 % — AB (ref 4.8–5.6)
MEAN PLASMA GLUCOSE: 151 mg/dL

## 2017-04-08 LAB — GLUCOSE, CAPILLARY
Glucose-Capillary: 137 mg/dL — ABNORMAL HIGH (ref 65–99)
Glucose-Capillary: 163 mg/dL — ABNORMAL HIGH (ref 65–99)

## 2017-04-08 NOTE — Progress Notes (Signed)
Inpatient Diabetes Program Recommendations  Diabetes Treatment Program Recommendations  ADA Standards of Care 2018 Diabetes in Pregnancy Target Glucose Ranges:  Fasting: 60 - 90 mg/dL Preprandial: 60 - 914 mg/dL 1 hr postprandial: Less than /dL (from first bite of meal) 2 hr postprandial: Less than 120 mg/dL (from first bite of meal)   Results for GOWRI, SUCHAN (MRN [id number]) as of 04/08/2017 07:17  Ref. Range 04/07/2017 19:08 04/08/2017 00:16 04/08/2017 05:38  Glucose-Capillary Latest Ref Range: 65 - 99 mg/dL 086 (H) 578 (H) 469 (H)    Review of Glycemic Control  Diabetes history: GDM Outpatient Diabetes medications: Glyburide 2.5 mg BID (per home medication list and H&P, patient has not been taking) Current orders for Inpatient glycemic control: CBGs fasting and 2H post prandial  Inpatient Diabetes Program Recommendations:  Oral Agents: Please consider reordering Glyburide as 2.5 mg QAM.  Thanks, Orlando Penner, RN, MSN, CDE Diabetes Coordinator Inpatient Diabetes Program [phone] (Team Pager from 8am to 5pm)

## 2017-04-08 NOTE — Progress Notes (Signed)
ACULTY PRACTICE ANTEPARTUM COMPREHENSIVE PROGRESS NOTE  Latoya Strickland is a 41 y.o. G2P0000 at [redacted]w[redacted]d  who is admitted for uncontrolled Chronic HTN and DM. Fetal presentation is cephalic. Length of Stay:  1  Days  Subjective: Pt without complaints this morning. She denies HA or visual changes.  Patient reports good fetal movement.  She reports no uterine contractions, no bleeding and no loss of fluid per vagina.  Vitals:  Blood pressure (!) 186/81, pulse 65, temperature 98.3 F (36.8 C), temperature source Oral, resp. rate 18, height  (1.778 m), weight 112.4 kg (247 lb 11.2 oz), last menstrual period 08/13/2016, SpO2 99 %, unknown if currently breastfeeding.   Physical Examination: Lungs clear Heart RRR Abd soft + bs gravid obese Ext trace edema, nl DTR's, no clonus  Fetal Monitoring:  Reactive NST  Labs:  Results for orders placed or performed during the hospital encounter of 04/07/17 (from the past 24 hour(s))  CBC   Collection Time: 04/07/17  3:52 PM  Result Value Ref Range   WBC 7.8 4.0 - 10.5 K/uL   RBC 3.79 (L) 3.87 - 5.11 MIL/uL   Hemoglobin 11.1 (L) 12.0 - 15.0 g/dL   HCT 16.1 (L) 09.6 - 04.5 %   MCV 83.4 78.0 - 100.0 fL   MCH 29.3 26.0 - 34.0 pg   MCHC 35.1 30.0 - 36.0 g/dL   RDW 40.9 81.1 - 91.4 %   Platelets 221 150 - 400 K/uL  Comprehensive metabolic panel   Collection Time: 04/07/17  3:52 PM  Result Value Ref Range   Sodium 136 135 - 145 mmol/L   Potassium 4.2 3.5 - 5.1 mmol/L   Chloride 107 101 - 111 mmol/L   CO2 22 22 - 32 mmol/L   Glucose, Bld 100 (H) 65 - 99 mg/dL   BUN 8 6 - 20 mg/dL   Creatinine, Ser 7.82 0.44 - 1.00 mg/dL   Calcium 8.5 (L) 8.9 - 10.3 mg/dL   Total Protein 6.1 (L) 6.5 - 8.1 g/dL   Albumin 2.8 (L) 3.5 - 5.0 g/dL   AST 15 15 - 41 U/L   ALT 11 (L) 14 - 54 U/L   Alkaline Phosphatase 66 38 - 126 U/L   Total Bilirubin 1.0 0.3 - 1.2 mg/dL   GFR calc non Af Amer >60 >60 mL/min   GFR calc Af Amer >60 >60 mL/min   Anion gap 7 5 - 15   Hemoglobin A1c   Collection Time: 04/07/17  3:52 PM  Result Value Ref Range   Hgb A1c MFr Bld 6.9 (H) 4.8 - 5.6 %   Mean Plasma Glucose 151 mg/dL  Type and screen [facility] [place]   Collection Time: 04/07/17  3:52 PM  Result Value Ref Range   ABO/RH(D) A POS    Antibody Screen NEG    Sample Expiration 04/10/2017   ABO/Rh   Collection Time: 04/07/17  3:52 PM  Result Value Ref Range   ABO/RH(D) A POS   Protein / creatinine ratio, urine   Collection Time: 04/07/17  6:08 PM  Result Value Ref Range   Creatinine, Urine 228.00 mg/dL   Total Protein, Urine 23 mg/dL   Protein Creatinine Ratio 0.10 0.00 - 0.15 mg/mg[Cre]  Glucose, capillary   Collection Time: 04/07/17  7:08 PM  Result Value Ref Range   Glucose-Capillary 152 (H) 65 - 99 mg/dL  Glucose, capillary   Collection Time: 04/08/17 12:16 AM  Result Value Ref Range   Glucose-Capillary 163 (H)  65 - 99 mg/dL  Glucose, capillary   Collection Time: 04/08/17  5:38 AM  Result Value Ref Range   Glucose-Capillary 137 (H) 65 - 99 mg/dL    Imaging Studies:    none   Medications:  Scheduled . docusate sodium  100 mg Oral Daily  . NIFEdipine  30 mg Oral BID  . prenatal multivitamin  1 tablet Oral Q1200   I have reviewed the patient's current medications.  ASSESSMENT:  IUP 32 1/7 week Chronic HTN DM, class b AMA  PLAN: BP has improved slightly with medication. CBG's are stable. Declines celestone. Will continue to monitor BP's and adjust medications as needed. D/C home once BP's are stable.   Continue routine antenatal care.   Hermina Staggers 04/08/2017,6:00 AM

## 2017-04-11 ENCOUNTER — Encounter: Payer: Self-pay | Admitting: General Practice

## 2017-04-11 ENCOUNTER — Encounter: Payer: Self-pay | Admitting: Family Medicine

## 2017-04-11 ENCOUNTER — Telehealth: Payer: Self-pay | Admitting: General Practice

## 2017-04-13 NOTE — Discharge Summary (Signed)
Pt left AMA °

## 2017-04-15 NOTE — Progress Notes (Signed)
Pt's significant other arrived to the room and was very irate and demanding that her IV be removed and discharge papers be brought in for her to sign. Dr Alysia Penna was notified that the patient was going to sign out AMA. Risks of signing out AMA were discussed with the patient and she verbalized understanding. IV was removed. Pt was ambulatory off the unit.

## 2017-04-18 ENCOUNTER — Ambulatory Visit (INDEPENDENT_AMBULATORY_CARE_PROVIDER_SITE_OTHER): Payer: Self-pay | Admitting: Clinical

## 2017-04-18 ENCOUNTER — Ambulatory Visit (INDEPENDENT_AMBULATORY_CARE_PROVIDER_SITE_OTHER): Payer: Self-pay | Admitting: Obstetrics and Gynecology

## 2017-04-18 VITALS — BP 240/138 | HR 76 | Wt 273.1 lb

## 2017-04-18 DIAGNOSIS — Z658 Other specified problems related to psychosocial circumstances: Secondary | ICD-10-CM

## 2017-04-18 DIAGNOSIS — R062 Wheezing: Secondary | ICD-10-CM

## 2017-04-18 DIAGNOSIS — O09521 Supervision of elderly multigravida, first trimester: Secondary | ICD-10-CM

## 2017-04-18 DIAGNOSIS — O0993 Supervision of high risk pregnancy, unspecified, third trimester: Secondary | ICD-10-CM

## 2017-04-18 DIAGNOSIS — O24112 Pre-existing diabetes mellitus, type 2, in pregnancy, second trimester: Secondary | ICD-10-CM

## 2017-04-18 DIAGNOSIS — O10919 Unspecified pre-existing hypertension complicating pregnancy, unspecified trimester: Secondary | ICD-10-CM

## 2017-04-18 MED ORDER — AZITHROMYCIN 250 MG PO TABS
ORAL_TABLET | ORAL | 1 refills | Status: DC
Start: 2017-04-18 — End: 2019-06-26

## 2017-04-18 MED ORDER — ALBUTEROL SULFATE HFA 108 (90 BASE) MCG/ACT IN AERS: 2.0000 | INHALATION_SPRAY | Freq: Four times a day (QID) | RESPIRATORY_TRACT | 2 refills | Status: DC | PRN

## 2017-04-18 NOTE — BH Specialist Note (Addendum)
Integrated Behavioral Health Initial Visit  MRN: [id number] Name: Latoya Strickland   Session Start time: 1:40 Session End time: 1:55 Total time: 15 minutes  Type of Service: Integrated Behavioral Health- Individual/Family Interpretor:No. Interpretor Name and Language: n/a   Warm Hand Off Completed.       SUBJECTIVE: Latoya Strickland is a 41 y.o. female accompanied by sister. Patient was referred by Dr Adrian Blackwater f/u for psychosocial/hypertension Patient reports the following symptoms/concerns: patient primary concern is stress over taking medication during pregnancy, as she feels it may be harmful, and is open to thinking about other strategies to cope with stress surrounding life changes, as well as hypertension. Duration of problem: Current pregnancy; Severity of problem: severe  OBJECTIVE: Mood: Appropriate and Affect: Appropriate Risk of harm to self or others: No plan to harm self or others   LIFE CONTEXT: Family and Social: Lives with FOB, sister, extended family School/Work: disability Self-Care: - Life Changes: Current pregnancy  GOALS ADDRESSED: Patient will reduce symptoms of: stress and increase knowledge and/or ability of: self-management skills and also: Increase healthy adjustment to current life circumstances   INTERVENTIONS: Motivational Interviewing and Psychoeducation and/or Health Education  Standardized Assessments completed: GAD-7 and PHQ 9  ASSESSMENT: Patient currently experiencing Psychosocial stressors. Patient may benefit from psychoeducation regarding behavioral changes to cope with hypertension.  PLAN: 1. Follow up with behavioral health clinician on : One week(will practice relaxation strategies at next visit) 2. Behavioral recommendations:  -Read educational material regarding behavioral changes to cope with hypertension(to discuss further at next visit) 3. Referral(s): Integrated Behavioral Health Services (In Clinic) 4. "From scale of 1-10,  how likely are you to follow plan?": 8  Rae Lips, LCSWA  Depression screen [facility] 2/9 04/18/2017 04/07/2017 03/22/2017 02/10/2017 01/27/2017  Decreased Interest 0 0 0 0 0  Down, Depressed, Hopeless 0 0 0 0 0  PHQ - 2 Score 0 0 0 0 0  Altered sleeping 1 0 - 0 1  Tired, decreased energy 0 0 - 0 1  Change in appetite 0 0 - 0 0  Feeling bad or failure about yourself  0 0 - 0 0  Trouble concentrating 0 0 - 0 0  Moving slowly or fidgety/restless 0 0 - 0 0  Suicidal thoughts 0 0 - 0 0  PHQ-9 Score 1 0 - 0 2   GAD 7 : Generalized Anxiety Score 04/18/2017 04/07/2017 02/10/2017 01/27/2017  Nervous, Anxious, on Edge 0 0 0 0  Control/stop worrying 0 0 0 0  Worry too much - different things 0 1 0 0  Trouble relaxing 0 0 0 0  Restless 0 0 0 0  Easily annoyed or irritable 1 0 0 0  Afraid - awful might happen 0 0 0 0  Total GAD 7 Score 1 1 0 0

## 2017-04-18 NOTE — Progress Notes (Signed)
Subjective:  Latoya Strickland is a 41 y.o. G1P0000 at [redacted]w[redacted]d being seen today for ongoing prenatal care.  She is currently monitored for the following issues for this high-risk pregnancy and has Diabetes (HCC); Essential hypertension; DJD (degenerative joint disease); Supervision of high-risk pregnancy; Hypertension in pregnancy; Diabetes in pregnancy; AMA (advanced maternal age) multigravida 35+, first trimester; Marijuana use; Hemorrhoids during pregnancy; Chronic hypertension during pregnancy, antepartum; and Wheezing on her problem list.  Patient reports wheezing and non productive cough for the last few days. She has as before not taking her medication. Left last hospitalization AMA..  Contractions: Not present. Vag. Bleeding: None.  Movement: Present. Denies leaking of fluid.   The following portions of the patient's history were reviewed and updated as appropriate: allergies, current medications, past family history, past medical history, past social history, past surgical history and problem list. Problem list updated.  Objective:   Vitals:   04/18/17 1325 04/18/17 1327  BP: (!) 248/126 (!) 240/138  Pulse: 80 76  Weight: 123.9 kg (273 lb 1.6 oz)     Fetal Status: Fetal Heart Rate (bpm): 152   Movement: Present     General:  Alert, oriented and cooperative. Patient is in no acute distress.  Skin: Skin is warm and dry. No rash noted.   Cardiovascular: Normal heart rate noted  Respiratory: Normal respiratory effort, scattered in/exp wheezes noted   Abdomen: Soft, gravid, appropriate for gestational age. Pain/Pressure: Absent     Pelvic:  Cervical exam deferred        Extremities: Normal range of motion.  Edema: Trace  Mental Status: Normal mood and affect. Normal behavior. Normal judgment and thought content.   Urinalysis:      Assessment and Plan:  Pregnancy: G1P0000 at [redacted]w[redacted]d  1. AMA (advanced maternal age) multigravida 35+, first trimester   2. Supervision of high risk  pregnancy in third trimester The importance of taking medication again stressed. Pt states again she is welling to take her medication but refuses to be admitted to hospital and antenatal testing.  3. Pre-existing type 2 diabetes mellitus during pregnancy in second trimester Glyburide   4. Chronic hypertension during pregnancy, antepartum Procardia  5. Wheezing  - albuterol (PROVENTIL HFA;VENTOLIN HFA) 108 (90 Base) MCG/ACT inhaler; Inhale 2 puffs into the lungs every 6 (six) hours as needed for wheezing or shortness of breath.  Dispense: 1 Inhaler; Refill: 2 - azithromycin (ZITHROMAX) 250 MG tablet; Take as directed: Two pills by mouth the first day, then one pill every day until completed  Dispense: 6 tablet; Refill: 1  Preterm labor symptoms and general obstetric precautions including but not limited to vaginal bleeding, contractions, leaking of fluid and fetal movement were reviewed in detail with the patient. Please refer to After Visit Summary for other counseling recommendations.  Return in about 1 week (around 04/25/2017) for OB visit.   Hermina Staggers, MD

## 2017-05-02 ENCOUNTER — Encounter: Payer: Self-pay | Admitting: Family Medicine

## 2017-05-06 ENCOUNTER — Inpatient Hospital Stay (HOSPITAL_COMMUNITY)
Admission: AD | Admit: 2017-05-06 | Discharge: 2017-05-06 | Disposition: A | Discharge status: Home / Self Care | Payer: Self-pay | Source: Ambulatory Visit | Attending: Obstetrics & Gynecology | Admitting: Obstetrics & Gynecology

## 2017-05-06 ENCOUNTER — Encounter (HOSPITAL_COMMUNITY): Payer: Self-pay | Admitting: *Deleted

## 2017-05-06 DIAGNOSIS — O99353 Diseases of the nervous system complicating pregnancy, third trimester: Secondary | ICD-10-CM | POA: Insufficient documentation

## 2017-05-06 DIAGNOSIS — O10019 Pre-existing essential hypertension complicating pregnancy, unspecified trimester: Secondary | ICD-10-CM

## 2017-05-06 DIAGNOSIS — R51 Headache: Secondary | ICD-10-CM | POA: Insufficient documentation

## 2017-05-06 DIAGNOSIS — O0990 Supervision of high risk pregnancy, unspecified, unspecified trimester: Secondary | ICD-10-CM

## 2017-05-06 DIAGNOSIS — H538 Other visual disturbances: Secondary | ICD-10-CM | POA: Insufficient documentation

## 2017-05-06 DIAGNOSIS — O24913 Unspecified diabetes mellitus in pregnancy, third trimester: Secondary | ICD-10-CM | POA: Insufficient documentation

## 2017-05-06 DIAGNOSIS — O26893 Other specified pregnancy related conditions, third trimester: Secondary | ICD-10-CM | POA: Insufficient documentation

## 2017-05-06 DIAGNOSIS — Z3A36 36 weeks gestation of pregnancy: Secondary | ICD-10-CM | POA: Insufficient documentation

## 2017-05-06 DIAGNOSIS — M1990 Unspecified osteoarthritis, unspecified site: Secondary | ICD-10-CM | POA: Insufficient documentation

## 2017-05-06 DIAGNOSIS — O99283 Endocrine, nutritional and metabolic diseases complicating pregnancy, third trimester: Secondary | ICD-10-CM | POA: Insufficient documentation

## 2017-05-06 DIAGNOSIS — G5600 Carpal tunnel syndrome, unspecified upper limb: Secondary | ICD-10-CM | POA: Insufficient documentation

## 2017-05-06 DIAGNOSIS — O09513 Supervision of elderly primigravida, third trimester: Secondary | ICD-10-CM | POA: Insufficient documentation

## 2017-05-06 DIAGNOSIS — O1413 Severe pre-eclampsia, third trimester: Secondary | ICD-10-CM

## 2017-05-06 DIAGNOSIS — O113 Pre-existing hypertension with pre-eclampsia, third trimester: Secondary | ICD-10-CM | POA: Insufficient documentation

## 2017-05-06 DIAGNOSIS — O10013 Pre-existing essential hypertension complicating pregnancy, third trimester: Secondary | ICD-10-CM

## 2017-05-06 DIAGNOSIS — O119 Pre-existing hypertension with pre-eclampsia, unspecified trimester: Secondary | ICD-10-CM

## 2017-05-06 LAB — COMPREHENSIVE METABOLIC PANEL
ALBUMIN: 2.3 g/dL — AB (ref 3.5–5.0)
ALK PHOS: 100 U/L (ref 38–126)
ALT: 10 U/L — ABNORMAL LOW (ref 14–54)
ANION GAP: 7 (ref 5–15)
AST: 17 U/L (ref 15–41)
BILIRUBIN TOTAL: 0.8 mg/dL (ref 0.3–1.2)
BUN: 12 mg/dL (ref 6–20)
CALCIUM: 8.5 mg/dL — AB (ref 8.9–10.3)
CO2: 24 mmol/L (ref 22–32)
Chloride: 102 mmol/L (ref 101–111)
Creatinine, Ser: 0.82 mg/dL (ref 0.44–1.00)
GFR calc non Af Amer: >60 mL/min (ref >60)
GLUCOSE: 146 mg/dL — AB (ref 65–99)
POTASSIUM: 3.8 mmol/L (ref 3.5–5.1)
SODIUM: 133 mmol/L — AB (ref 135–145)
TOTAL PROTEIN: 5.3 g/dL — AB (ref 6.5–8.1)

## 2017-05-06 LAB — PROTEIN / CREATININE RATIO, URINE
Creatinine, Urine: 196 mg/dL
PROTEIN CREATININE RATIO: 5.96 mg/mg{creat} — AB (ref 0.00–0.15)
TOTAL PROTEIN, URINE: 1168 mg/dL

## 2017-05-06 LAB — CBC
HEMATOCRIT: 34.3 % — AB (ref 36.0–46.0)
HEMOGLOBIN: 12.1 g/dL (ref 12.0–15.0)
MCH: 29.3 pg (ref 26.0–34.0)
MCHC: 35.3 g/dL (ref 30.0–36.0)
MCV: 83.1 fL (ref 78.0–100.0)
Platelets: 222 10*3/uL (ref 150–400)
RBC: 4.13 MIL/uL (ref 3.87–5.11)
RDW: 14.6 % (ref 11.5–15.5)
WBC: 7.3 10*3/uL (ref 4.0–10.5)

## 2017-05-06 LAB — ABO/RH: ABO/RH(D): A POS

## 2017-05-06 MED ORDER — MAGNESIUM SULFATE BOLUS VIA INFUSION: 4.0000 g | Freq: Once | INTRAVENOUS | Status: DC

## 2017-05-06 MED ORDER — LABETALOL HCL 5 MG/ML IV SOLN
20.0000 mg | INTRAVENOUS | Status: DC | PRN
  Administered 2017-05-06: 40 mg via INTRAVENOUS
  Administered 2017-05-06: 20 mg via INTRAVENOUS
  Filled 2017-05-06: qty 4
  Filled 2017-05-06: qty 8
  Filled 2017-05-06: qty 16

## 2017-05-06 MED ORDER — MAGNESIUM SULFATE BOLUS VIA INFUSION
4.0000 g | INTRAVENOUS | Status: AC
  Administered 2017-05-06: 4 g via INTRAVENOUS
  Filled 2017-05-06: qty 500

## 2017-05-06 MED ORDER — HYDRALAZINE HCL 20 MG/ML IJ SOLN: 10.0000 mg | Freq: Once | INTRAMUSCULAR | Status: DC | PRN

## 2017-05-06 MED ORDER — MAGNESIUM SULFATE 40 G IN LACTATED RINGERS - SIMPLE
2.0000 g/h | INTRAVENOUS | Status: DC
  Administered 2017-05-06: 2 g/h via INTRAVENOUS
  Filled 2017-05-06: qty 500

## 2017-05-06 MED ORDER — LACTATED RINGERS IV SOLN
INTRAVENOUS | Status: DC
  Administered 2017-05-06: 15:00:00 via INTRAVENOUS

## 2017-05-06 MED ORDER — NIFEDIPINE 10 MG PO CAPS
20.0000 mg | ORAL_CAPSULE | Freq: Once | ORAL | Status: AC
  Administered 2017-05-06: 20 mg via ORAL
  Filled 2017-05-06: qty 2

## 2017-05-06 NOTE — Progress Notes (Signed)
Dr. Macon LargeAnyanwu and Dr. Omer JackMumaw at bedside to discuss plan of care with patient.  Patient on phone with multiple friends and family as doctors trying to talk to patient.  Waiting for Neonatologist to come down and speak with patient about delivery prior to 37 weeks per patient request.

## 2017-05-06 NOTE — Progress Notes (Signed)
Patient asking RNs to leave room when attempted to replace FHR monitors.  Patient tearful and on phone.  Asking to be alone at this time and replace monitors in a few minutes.

## 2017-05-06 NOTE — Progress Notes (Signed)
Dr. Macon LargeAnyanwu at bedside discussing plan of care with patient.  Patient refusing medications at this time except for Procardia.

## 2017-05-06 NOTE — Progress Notes (Addendum)
Discussed POC with Dr. Omer JackMumaw.  Patient's BP down to 170s/103 after Magnesium bolus and 40mg  of Labetalol. Will hold off on giving more Labetalol for now while BPs are in the 170s-180s.  Dr. Omer JackMumaw coming down to see patient and discuss the plan for admission.

## 2017-05-06 NOTE — Consult Note (Signed)
Neonatology Consult  Note:  At the request of the patients obstetrician Dr. Harolyn Rutherford / Mumaw I met with Latoya Strickland who is 49 weeks currently with pregnancy complicated by severe preeclampsia, obesity, Diabetes ([place]); Essential hypertension; DJD (degenerative joint disease); AMA (advanced maternal age); Marijuana use.  She presented to MAU with severe range blood pressures however is refusing induction of labor due to concerns over both the induction itself and that "the baby is too early".   I discussed late preterm delivery at 74 weeks explaining that most late preterm babies do very well and have a low risk of serious morbidity or mortality.  We discussed the fact that her baby would not automatically be admitted to the NICU unless there were symptoms that warranted admission.  In particular we discussed that the risks of staying pregnant with severe preeclampsia far outweighed the risks posed to her baby by early delivery at [redacted] weeks gestation.  In particular I emphasized the risks to the fetus, including mortality should she refuse induction.  I cited her risk of eclampsia with the very real risk of poor blood supply in the setting of eclamptic seizure with corresponding fetal brain injury and or mortality. I discussed that delivering the baby now would most likely result in a healthier baby than should she wait and become even sicker than she is currently.  She seemed understanding of our discussion and voiced understand of the risks of not having her labor induced however she reiterated that she did not want induction of labor and wanted to leave against medical advice.   Thank you for allowing [place] to participate in her care.    Higinio Roger, DO  Neonatologist  The total length of face-to-face or floor / unit time for this encounter was 30 minutes.  Counseling and / or coordination of care was greater than fifty percent of the time.

## 2017-05-06 NOTE — Progress Notes (Signed)
Late Entry: [id number] [place] Police present in room to let patient know that her SO was informed that he is banned from all [facility] premises due to him threatening to hurt staff.  Security and House Coverage on unit letting staff know to keep watch for patient's SO.

## 2017-05-06 NOTE — MAU Note (Addendum)
Pt as had a HA for the past 4 days , (10+), vision has been blurring, pitting edema noted in lower legs/  Eyelids are swollen, left has noticeable droop.  Denies bleeding or leaking.

## 2017-05-06 NOTE — MAU Note (Signed)
Urine in lab 

## 2017-05-06 NOTE — Progress Notes (Signed)
Late Entry:  Dr. Macon LargeAnyanwu and Dr. Omer JackMumaw at bedside with patient.  Patient states she wanted to leave AMA. Dr Macon LargeAnyanwu discussed all of the risks of leaving AMA and patient still wanted to leave.  Patient signed AMA form and left at 1710.

## 2017-05-06 NOTE — MAU Provider Note (Signed)
Chief Complaint:  Headache; Blurred Vision; and Leg Swelling  HPI: Latoya Strickland is a 41 y.o. G1P0000 at 49w1dwho presents to maternity admissions reporting headache, blurred vision, and leg swelling.  Patient coming in today due to headache that is 10/10 for the past 4 days without any relief, as well as having blurred vision and severe pitting edema of bilateral lower extremities, including swelling of hands and face as well. She states she knows she has severe BPs (as she has most of her pregnancy), stating at home when she has taken her BP it has been in the 260s/130s fairly consistently the past few days. She denies any seizure like activity at home. She states she is not taking any medications that have been previously prescribed for her, FOB has emphasized in previous encounters including today on the phone, that medication is "poison to the baby" and will be more harmful for the baby. Denies LOF, VB. States having occasional contractions. Does report good fetal movement daily.   Patient has known uncontrolled chronic hypertension, having been admitted to the hospital previously this pregnancy, last on 4/19-4/20, for blood pressure controlled (also in severe BP range of 190s/110s), but left AMA at that admission. She has been counseled many times previously both in hospital and in office regarding her uncontrolled hypertension and uncontrolled diabetes, and the effects on the baby and patient, as well as possible outcomes including DEATH of her baby or other serious morbidities, and even severe morbidity and mortality to herself. Patient continually refuses to take medications outside of the hospital and frequently misses appointments.    Past Medical History: Past Medical History:  Diagnosis Date  . Arthritis   . Blood transfusion without reported diagnosis   . Carpal tunnel syndrome   . Degenerative joint disease   . Diabetes [place] complication [place]   . DJD (degenerative joint  disease)   . Hypertension   . Lumbar herniated disc   . PCOS (polycystic ovarian syndrome)     Past obstetric history: OB History  Gravida Para Term Preterm AB Living  1 0 0 0 0 0  SAB TAB Ectopic Multiple Live Births  0 0 0 0 0    # Outcome Date GA Lbr Len/2nd Weight Sex Delivery Anes PTL Lv  1 Current               Past Surgical History: Past Surgical History:  Procedure Laterality Date  . WISDOM TOOTH EXTRACTION       Family History: Family History  Problem Relation Age of Onset  . Hypertension Mother   . Heart disease Mother   . Varicose Veins Mother   . Cancer Father   . Diabetes Father   . Diabetes Paternal Grandmother     Social History: Social History  Substance Use Topics  . Smoking status: Never Smoker  . Smokeless tobacco: Never Used  . Alcohol use No    Allergies:  Allergies  Allergen Reactions  . Lyrica [Pregabalin] Other (See Comments)    Hair loss   . Ultracet [Tramadol-Acetaminophen] Other (See Comments)    Stomach burning   . Ultram [Tramadol] Other (See Comments)    Stomach burning     Meds:  Prescriptions Prior to Admission  Medication Sig Dispense Refill Last Dose  . Prenatal MV-Min-FA-Omega-3 (PRENATAL GUMMIES/DHA & FA PO) Take 1.25 tablets by mouth daily.   05/05/2017 at Unknown time  . albuterol (PROVENTIL HFA;VENTOLIN HFA) 108 (90 Base) MCG/ACT inhaler Inhale 2 puffs into  the lungs every 6 (six) hours as needed for wheezing or shortness of breath. 1 Inhaler 2 emergency  . azithromycin (ZITHROMAX) 250 MG tablet Take as directed: Two pills by mouth the first day, then one pill every day until completed 6 tablet 1 04/29/2017  . Blood Glucose Monitoring Suppl (ACCU-CHEK NANO SMARTVIEW) w/Device KIT 1 kit by Subdermal route as directed. Check blood sugars for fasting, and two hours after breakfast, lunch and dinner (4 checks daily) (Patient not taking: Reported on 04/18/2017) 1 kit 0 Not Taking  . glyBURIDE (DIABETA) 2.5 MG tablet Take 1  tablet (2.5 mg total) by mouth 2 (two) times daily with a meal. (Patient not taking: Reported on 04/07/2017) 60 tablet 3 Not Taking  . NIFEdipine (PROCARDIA-XL/ADALAT-CC/NIFEDICAL-XL) 30 MG 24 hr tablet Take 1 tablet (30 mg total) by mouth 2 (two) times daily. (Patient not taking: Reported on 03/29/2017) 60 tablet 3 Not Taking    I have reviewed patient's Past Medical Hx, Surgical Hx, Family Hx, Social Hx, medications and allergies.   ROS:  A comprehensive ROS was negative except per HPI.    Physical Exam  Patient Vitals for the past 24 hrs:  BP Temp Temp src Pulse Resp SpO2  05/06/17 1645 (!) 169/100 - - 70 - -  05/06/17 1630 (!) 160/94 - - 75 - 98 %  05/06/17 1615 (!) 166/98 - - 70 - 98 %  05/06/17 1605 (!) 174/103 - - 70 - 98 %  05/06/17 1550 (!) 183/123 - - 84 - 99 %  05/06/17 1543 - - - - - 99 %  05/06/17 1515 (!) 220/129 - - 77 - 100 %  05/06/17 1500 (!) 213/127 - - 82 - 100 %  05/06/17 1430 (!) 215/125 - - 78 - 99 %  05/06/17 1420 (!) 226/128 - - 79 - 100 %  05/06/17 1410 (!) 211/110 - - 76 (!) 24 -  05/06/17 1340 (!) 232/133 98.2 F (36.8 C) Oral 84 (!) 22 100 %   Constitutional: Well-developed, well-nourished morbidly obese female in no acute distress.  HEENT: EOMI, normocephalic, atraumatic, +periorbital edema. No LAD Cardiovascular: normal rate, rhythm, no murmurs Respiratory: normal effort, CTAB GI: Abd soft, non-tender, morbidly obese, gravid appropriate for gestational age but limited evaluation due to body habitus.   MS: Extremities nontender, 2+ pitting edema bilaterally lower extremities to mid-thigh, normal ROM. +hand edema bilaterally Neurologic: Alert and oriented x 4.  2/4 bilateral patellar reflexes. +brachioradialis reflexes bilaterally. SVE: Dilation: Closed Presentation: Vertex (BS US performed) Exam by:: Dr. Harolyn Rutherford      Labs: Results for orders placed or performed during the hospital encounter of 05/06/17 (from the past 24 hour(s))  Protein /  creatinine ratio, urine     Status: Abnormal   Collection Time: 05/06/17  1:40 PM  Result Value Ref Range   Creatinine, Urine 196.00 mg/dL   Total Protein, Urine 1,168 mg/dL   Protein Creatinine Ratio 5.96 (H) 0.00 - 0.15 mg/mg[Cre]  CBC     Status: Abnormal   Collection Time: 05/06/17  2:15 PM  Result Value Ref Range   WBC 7.3 4.0 - 10.5 K/uL   RBC 4.13 3.87 - 5.11 MIL/uL   Hemoglobin 12.1 12.0 - 15.0 g/dL   HCT 34.3 (L) 36.0 - 46.0 %   MCV 83.1 78.0 - 100.0 fL   MCH 29.3 26.0 - 34.0 pg   MCHC 35.3 30.0 - 36.0 g/dL   RDW 14.6 11.5 - 15.5 %   Platelets 222 150 -  400 K/uL  Comprehensive metabolic panel     Status: Abnormal   Collection Time: 05/06/17  2:15 PM  Result Value Ref Range   Sodium 133 (L) 135 - 145 mmol/L   Potassium 3.8 3.5 - 5.1 mmol/L   Chloride 102 101 - 111 mmol/L   CO2 24 22 - 32 mmol/L   Glucose, Bld 146 (H) 65 - 99 mg/dL   BUN 12 6 - 20 mg/dL   Creatinine, Ser 0.82 0.44 - 1.00 mg/dL   Calcium 8.5 (L) 8.9 - 10.3 mg/dL   Total Protein 5.3 (L) 6.5 - 8.1 g/dL   Albumin 2.3 (L) 3.5 - 5.0 g/dL   AST 17 15 - 41 U/L   ALT 10 (L) 14 - 54 U/L   Alkaline Phosphatase 100 38 - 126 U/L   Total Bilirubin 0.8 0.3 - 1.2 mg/dL   GFR calc non Af Amer >60 >60 mL/min   GFR calc Af Amer >60 >60 mL/min   Anion gap 7 5 - 15  ABO/Rh     Status: None   Collection Time: 05/06/17  2:15 PM  Result Value Ref Range   ABO/RH(D) A POS    Imaging: Bedside ultrasound demonstrated vertex presentation.  MAU Course: CBC - Plt 222 CMP - Cr 0.82 (baseline 0.57) ; LFTs/bilirubin WNL PC ratio - 5.96! (previously on 4/19 was 0.10) ABO HIV/RPR  Patient eventually accepted Magnesium bolus and IV.    MDM: Plan of care reviewed with patient, including labs and tests ordered and medical treatment. Discussed with patient at length regarding the serious implications of her diagnosis of severe preeclampsia (chronic hypertension with superimposed preeclampsia with severe features- PC ratio  5.96, headache, blurred vision), and that the only true treatment is delivery, and that blood pressure control and magnesium for seizure prophylaxis is only a temporary treatment until she can safely deliver the baby (but that seizures can still occur or other emergent situations like placental abruption, that would result in emergency cesarean section). Patient still will not accept being induced for delivery, stating that she feels she is "too early" to have the baby. Reassured patient that the baby is near term, considered late preterm and that most babies do well even if born this early. She states "God did not intend for the baby to be delivered this early, and will protect the baby until her due date when she is supposed to have her". Discussed the high likelihood that going untreated with severe preeclampsia, especially with the severely elevated BPs she has, could result in SEIZURES, STROKE, PLACENTAL ABRUPTION, DEATH OF BABY OR HER, OR SERIOUS MORBIDITY TO BABY OR HER.  3:00 PM - Returned to the room. Dr. Harolyn Rutherford was also present and presented the information regarding her diagnosis and illness and severity of this situation, and presented patient with the recommended advice for treatment, which included delivery. Patient was resistant to the idea of IOL. Patient requested time to think about it. Given patient time to register discussion and to give answer about starting magnesium. Also discussed with patient (per her request) to give her until 4PM, to give an answer about letting us induce her. Reminded patient again regarding not treating appropriately, especially not delivering the baby, that it could result in the following, but not limited to: STROKE, SEIZURES, PLACENTAL ABRUPTION, DEATH (mom or baby), especially can result in an emergent situation that may include emergency cesarean and delivery of the baby in this manner.   3:30 PM - Arrived in room  to see if patient would allow [place] to start  magnesium drip for seizure prophylaxis. Patient eventually consented to allow this. States she still needs time to talk with family and FOB about induction, would still like to wait until 4 PM to given an answer on IOL. Magnesium started at 15:43.  Reminded patient AGAIN regarding not treating appropriately, especially not delivering the baby, that it could result in STROKE, SEIZURES, PLACENTAL ABRUPTION, DEATH (mom or baby), especially can result in an emergent situation that may include emergency cesarean and delivery of the baby in this manner.   4:10 PM - Arrived in room, she is still on the phone with family/FOB. Patient's BP had decreased slightly after given Procardia, IV labetalol, and IV magnesium. Waiting 5 minutes for patient to get off the phone with family/FOB. Patient is still wanting to be treated medically but refusing to have induction performed. FOB while on the phone, states "you just need a routine check up and to leave." FOB repeatedly telling patient to not allow [place] to induce her, that we're purposely trying to harm the child and her. States medication is poisonous. Patient states she does not like taking medication, and concerned about her niece who had a near death experience "from the induction medicine." Discussed with patient this likely did not happen from the medication she was given, but perhaps something else and without knowing her situation it is difficult to say, but tried to give reassurance she will be under our care and watched very closely. That by not inducing the labor and especially by not treating her appropriately, there's more likely a chance that DEATH of the baby and even death of her could occur and would be more likely than death from the medication we would be giving her. Patient in tears constantly calling FOB who is telling the patient to leave the hospital, she would be safer at home and without medication. Patient also trying to convince [place] to let the FOB  present and that she would consider then allowing [place] to induce her, but explained again to the patient that the FOB is forever banned from [facility] campuses due to frequently threatening staff's lives.   4:45 PM - Dr. Higinio Roger arrived to speak to patient about outcomes for baby's this late preterm, and also outcomes of baby's with mother with severe preeclampsia who go untreated (aka if she leaves today). Patient listened to Dr. Higinio Roger and reiterated his education, but still wants to leave. Patient seemed to understand the serious health concerns and serious medical diagnosis she is facing, even reiterated this back to me. Asked her to please explain how she can still decide to leave AMA knowing and understanding all of this, and she responded, "God will not allow my baby to die, and is not in His plan to have the baby born this early." She signed the [place] papers and transitioned off of Magnesium and decided to leave against all medical advice.  5:10 PM - Patient finally left MAU, against medical advice.   TOTAL LENGTH OF FACE TO FACE OR FLOOR/UNIT TIME FOR THIS ENCOUNTER WITH THE PATIENT WAS 2 HOURS. COUNSELING AND/OR COORDINATION OF CARE WAS GREATER THAN 70% OF THE TIME.   Assessment/Plan:  1) Chronic Hypertension with superimposed preeclampsia with severe features (Headache and severe range BPs)  - Recommended to induce labor, control BPs and start magnesium sulfate gtt  - Patient refused IOL, and had refused magnesium (but eventually was OK with starting it). Please see above  for further details.  - Patient left AGAINST MEDICAL ADVICE, and reminded how serious her condition is and that leaving without delivering the baby by induction or cesarean and without going through treatment is risking stroke/seizures(eclampsia)/placental abruption/death/intrauterine fetal demise. Patient acknowledged risk, but ultimately decided to go home.    Katherine Basset, DO OB Fellow Center for [facility], [facility] 05/06/2017 5:17 PM

## 2017-05-07 LAB — RPR: RPR: NONREACTIVE

## 2017-05-07 LAB — HIV ANTIBODY (ROUTINE TESTING W REFLEX): HIV SCREEN 4TH GENERATION: NONREACTIVE

## 2017-05-11 ENCOUNTER — Encounter: Payer: Self-pay | Admitting: Obstetrics and Gynecology

## 2017-05-17 ENCOUNTER — Encounter: Payer: Self-pay | Admitting: Obstetrics and Gynecology

## 2017-05-17 ENCOUNTER — Ambulatory Visit: Payer: Self-pay

## 2017-05-25 ENCOUNTER — Encounter: Payer: Self-pay | Admitting: Obstetrics and Gynecology

## 2017-06-09 ENCOUNTER — Ambulatory Visit: Payer: Self-pay | Admitting: Family Medicine

## 2017-09-20 ENCOUNTER — Encounter (HOSPITAL_COMMUNITY): Payer: Self-pay

## 2018-01-30 ENCOUNTER — Encounter: Payer: Self-pay | Admitting: Family Medicine

## 2018-01-30 ENCOUNTER — Ambulatory Visit (INDEPENDENT_AMBULATORY_CARE_PROVIDER_SITE_OTHER): Payer: Medicaid Other | Admitting: Family Medicine

## 2018-01-30 VITALS — BP 166/100 | HR 66 | Temp 98.1°F | Resp 16 | Ht 70.0 in | Wt 256.0 lb

## 2018-01-30 DIAGNOSIS — Z6836 Body mass index (BMI) 36.0-36.9, adult: Secondary | ICD-10-CM | POA: Diagnosis not present

## 2018-01-30 DIAGNOSIS — E139 Other specified diabetes mellitus without complications: Secondary | ICD-10-CM | POA: Diagnosis not present

## 2018-01-30 DIAGNOSIS — I499 Cardiac arrhythmia, unspecified: Secondary | ICD-10-CM | POA: Diagnosis not present

## 2018-01-30 DIAGNOSIS — I10 Essential (primary) hypertension: Secondary | ICD-10-CM

## 2018-01-30 LAB — POCT GLYCOSYLATED HEMOGLOBIN (HGB A1C): HEMOGLOBIN A1C: 6.5

## 2018-01-30 LAB — POCT URINALYSIS DIP (DEVICE)
Bilirubin Urine: NEGATIVE
GLUCOSE, UA: NEGATIVE mg/dL
Ketones, ur: NEGATIVE mg/dL
Leukocytes, UA: NEGATIVE
NITRITE: NEGATIVE
PROTEIN: 30 mg/dL — AB
Specific Gravity, Urine: 1.02 (ref 1.005–1.030)
Urobilinogen, UA: 0.2 mg/dL (ref 0.0–1.0)
pH: 6.5 (ref 5.0–8.0)

## 2018-01-30 MED ORDER — HYDROCHLOROTHIAZIDE 25 MG PO TABS: 25.0000 mg | ORAL_TABLET | Freq: Every day | ORAL | 3 refills | Status: DC

## 2018-01-30 MED ORDER — HYDRALAZINE HCL 100 MG PO TABS: 100.0000 mg | ORAL_TABLET | Freq: Two times a day (BID) | ORAL | 2 refills | Status: DC

## 2018-01-30 MED ORDER — NIFEDIPINE ER 60 MG PO TB24: 60.0000 mg | ORAL_TABLET | Freq: Every day | ORAL | 1 refills | Status: DC

## 2018-01-30 MED ORDER — METFORMIN HCL 500 MG PO TABS: 500.0000 mg | ORAL_TABLET | Freq: Two times a day (BID) | ORAL | 3 refills | Status: DC

## 2018-01-30 NOTE — Progress Notes (Signed)
Patient ID: Latoya Strickland, female    DOB: 1976-09-13, 42 y.o.   MRN: [id number]  PCP: Scot Jun, FNP  Chief Complaint  Patient presents with  . Follow-up    HTN    Subjective:  HPI Latoya Strickland is a 42 y.o. female with history of accelerated hypertension, T2DM, obesity, presents for evaluation of hypertensin and resuming blood pressure medications. Latoya Strickland has a long history of uncontrolled blood pressure secondary to medication non-compliance. Latoya Strickland delivered a healthy infant son approximately 8 months ago.  Throughout pregnancy Latoya Strickland suffered from accelerated hypertension with systolic readings greater than 884 and diastolic greater than 166.  She has not routinely been checking her blood pressures since stopping her antihypertensive medications.  She denies any dizziness, shortness of breath, chest pain,  or headaches.  She complains of some lower extremity edema however notes this is been present throughout her recent pregnancy and postpartum period.  Latoya Strickland suffers from type 2 diabetes last A1c 6.9. Current Body mass index is 36.73 kg/m.  She has also been off of metformin for several months.  She reports while taking metformin she tolerated medication without GI issues.  She does not routinely check her blood sugar although was prescribed a meter during her recent pregnancy. Social History   Socioeconomic History  . Marital status: Single    Spouse name: Not on file  . Number of children: Not on file  . Years of education: Not on file  . Highest education level: Not on file  Social Needs  . Financial resource strain: Not on file  . Food insecurity - worry: Not on file  . Food insecurity - inability: Not on file  . Transportation needs - medical: Not on file  . Transportation needs - non-medical: Not on file  Occupational History  . Not on file  Tobacco Use  . Smoking status: Never Smoker  . Smokeless tobacco: Never Used  Substance and Sexual Activity  . Alcohol  use: No  . Drug use: Yes    Types: Marijuana    Comment: 01/11/17 +THC  . Sexual activity: Yes    Birth control/protection: None  Other Topics Concern  . Not on file  Social History Narrative  . Not on file    Family History  Problem Relation Age of Onset  . Hypertension Mother   . Heart disease Mother   . Varicose Veins Mother   . Cancer Father   . Diabetes Father   . Diabetes Paternal Grandmother      Review of Systems  Constitutional: Negative.   HENT: Negative.   Respiratory: Negative.   Cardiovascular: Positive for palpitations and leg swelling.       Reports experiencing palpitations   Genitourinary: Negative.   Allergic/Immunologic: Negative.   Neurological: Negative.   Psychiatric/Behavioral: Negative.     Patient Active Problem List   Diagnosis Date Noted  . Wheezing 04/18/2017  . Chronic hypertension during pregnancy, antepartum 04/07/2017  . Hemorrhoids during pregnancy 11/29/2016  . Marijuana use 11/20/2016  . Supervision of high-risk pregnancy 11/15/2016  . Hypertension in pregnancy 11/15/2016  . Diabetes in pregnancy 11/15/2016  . AMA (advanced maternal age) multigravida 55+, first trimester 11/15/2016  . Diabetes ([place]) 08/09/2016  . Essential hypertension 08/09/2016  . DJD (degenerative joint disease) 08/09/2016    Allergies  Allergen Reactions  . Lyrica [Pregabalin] Other (See Comments)    Hair loss   . Ultracet [Tramadol-Acetaminophen] Other (See Comments)    Stomach burning   . Ultram [  Tramadol] Other (See Comments)    Stomach burning     Prior to Admission medications   Medication Sig Start Date End Date Taking? Authorizing Provider  albuterol (PROVENTIL HFA;VENTOLIN HFA) 108 (90 Base) MCG/ACT inhaler Inhale 2 puffs into the lungs every 6 (six) hours as needed for wheezing or shortness of breath. Patient not taking: Reported on 01/30/2018 04/18/17   Chancy Milroy, MD  azithromycin [facility]) 250 MG tablet Take as directed: Two  pills by mouth the first day, then one pill every day until completed Patient not taking: Reported on 01/30/2018 04/18/17   Chancy Milroy, MD  Blood Glucose Monitoring Suppl (ACCU-CHEK NANO SMARTVIEW) w/Device KIT 1 kit by Subdermal route as directed. Check blood sugars for fasting, and two hours after breakfast, lunch and dinner (4 checks daily) Patient not taking: Reported on 04/18/2017 01/06/17   Constant, Peggy, MD  glyBURIDE (DIABETA) 2.5 MG tablet Take 1 tablet (2.5 mg total) by mouth 2 (two) times daily with a meal. Patient not taking: Reported on 04/07/2017 11/15/16   Guss Bunde, MD  NIFEdipine (PROCARDIA-XL/ADALAT-CC/NIFEDICAL-XL) 30 MG 24 hr tablet Take 1 tablet (30 mg total) by mouth 2 (two) times daily. Patient not taking: Reported on 03/29/2017 03/24/17   Truett Mainland, DO  Prenatal MV-Min-FA-Omega-3 (PRENATAL GUMMIES/DHA & FA PO) Take 1.25 tablets by mouth daily.    [provider]    Past Medical, Surgical Family and Social History reviewed and updated.    Objective:   Today's Vitals   01/30/18 1319 01/30/18 1344  BP: (!) 180/100 (!) 166/100  Pulse: 65 66  Resp: 16   Temp: 98.1 F (36.7 C)   TempSrc: Oral   SpO2: 99%   Weight: 256 lb (116.1 kg)   Height: '5\' 10"'$  (1.778 m)     Wt Readings from Last 3 Encounters:  01/30/18 256 lb (116.1 kg)  04/18/17 273 lb 1.6 oz (123.9 kg)  04/07/17 247 lb 11.2 oz (112.4 kg)   Physical Exam  Constitutional: She appears well-developed and well-nourished.  HENT:  Head: Normocephalic and atraumatic.  Eyes: Conjunctivae and EOM are normal. Pupils are equal, round, and reactive to light.  Neck: Normal range of motion. Neck supple.  Cardiovascular: An irregularly irregular rhythm present. Bradycardia present.  Pulses:      Radial pulses are 2+ on the right side, and 2+ on the left side.  Pulmonary/Chest: Effort normal and breath sounds normal.  Abdominal: Soft. Bowel sounds are normal.  Musculoskeletal: She exhibits  edema.  Lymphadenopathy:    She has no cervical adenopathy.  Skin: Skin is warm and dry.  Psychiatric: She has a normal mood and affect. Her behavior is normal. Judgment and thought content normal.    Assessment & Plan:  1. Diabetes mellitus of other type without complication, unspecified whether long term insulin use ([place])- POCT glycosylated hemoglobin (Hb A1C) 6.5 today. Will resume Metformin twice daily with meals. She is overdue for a diabetic eye exam. Will refer to Ophthalmology.  2. Essential hypertension, resume Procardia, HCTZ, and Hydralazine.  We have discussed target BP range and blood pressure goal. I have advised patient to check BP regularly and to call us back or report to clinic if the numbers are consistently higher than 140/90. We discussed the importance of compliance with medical therapy and DASH diet recommended, consequences of uncontrolled hypertension discussed.    3. Irregular heart rhythm, noted on exam today.  - EKG 12-Lead, Sinus Bradycardia -BPM 48.  Auscultated irregular-regular heart  rhythm. Given patients longstanding history of accelerated hypertension and increased risk for heart failure, will refer to cardiology for a second opinion. Ambulatory referral to Cardiology  4. Body mass index (BMI) of 36.0-36.9 in adult Goal BMI  <25. Encouraged efforts to reduce weight include engaging in physical activity as tolerated with goal of 150 minutes per week. Improve dietary choices and eat a meal regimen consistent with a Mediterranean or DASH diet. Reduce simple carbohydrates. Do not skip meals and eat healthy snacks throughout the day to avoid over-eating at dinner. Set a goal weight loss that is achievable for you.   Meds ordered this encounter  Medications  . NIFEdipine (PROCARDIA-XL/ADALAT CC) 60 MG 24 hr tablet    Sig: Take 1 tablet (60 mg total) by mouth daily.    Dispense:  270 tablet    Refill:  1    Order Specific Question:   Supervising Provider     Answer:   Tresa Garter [id number]  . metFORMIN (GLUCOPHAGE) 500 MG tablet    Sig: Take 1 tablet (500 mg total) by mouth 2 (two) times daily with a meal.    Dispense:  180 tablet    Refill:  3    Order Specific Question:   Supervising Provider    Answer:   Tresa Garter [id number]  . hydrALAZINE (APRESOLINE) 100 MG tablet    Sig: Take 1 tablet (100 mg total) by mouth 2 (two) times daily.    Dispense:  180 tablet    Refill:  2    Order Specific Question:   Supervising Provider    Answer:   Tresa Garter [id number]  . hydrochlorothiazide (HYDRODIURIL) 25 MG tablet    Sig: Take 1 tablet (25 mg total) by mouth daily.    Dispense:  90 tablet    Refill:  3    Order Specific Question:   Supervising Provider    Answer:   Tresa Garter [id number]    Orders Placed This Encounter  Procedures  . Comprehensive metabolic panel  . Thyroid Panel With TSH  . CBC with Differential  . Ambulatory referral to Ophthalmology  . Ambulatory referral to Cardiology  . POCT glycosylated hemoglobin (Hb A1C)  . POCT urinalysis dip (device)  . EKG 12-Lead  . EKG    RTC: Hypertension follow-up  recheck 03/13/2018   Carroll Sage. Kenton Kingfisher, MSN, FNP-C [facility]  [address] [place], [place] [address] [phone]

## 2018-01-30 NOTE — Patient Instructions (Addendum)
Contact [facility] [phone] to schedule a follow-up post partum visit.   You are being prescribed the following medications for blood pressure management: Hydralazine 100 mg twice daily. Procardia 60 mg once daily. Hydrochlorothiazide 25 mg once daily.   I am referring you to cardiology for further evaluation of slow heart rate. If began to experience any dizziness or swelling worsens return for care.    Hypertension Hypertension is another name for high blood pressure. High blood pressure forces your heart to work harder to pump blood. This can cause problems over time. There are two numbers in a blood pressure reading. There is a top number (systolic) over a bottom number (diastolic). It is best to have a blood pressure below 120/80. Healthy choices can help lower your blood pressure. You may need medicine to help lower your blood pressure if:  Your blood pressure cannot be lowered with healthy choices.  Your blood pressure is higher than 130/80.  Follow these instructions at home: Eating and drinking  If directed, follow the DASH eating plan. This diet includes: ? Filling half of your plate at each meal with fruits and vegetables. ? Filling one quarter of your plate at each meal with whole grains. Whole grains include whole wheat pasta, brown rice, and whole grain bread. ? Eating or drinking low-fat dairy products, such as skim milk or low-fat yogurt. ? Filling one quarter of your plate at each meal with low-fat (lean) proteins. Low-fat proteins include fish, skinless chicken, eggs, beans, and tofu. ? Avoiding fatty meat, cured and processed meat, or chicken with skin. ? Avoiding premade or processed food.  Eat less than 1,500 mg of salt (sodium) a day.  Limit alcohol use to no more than 1 drink a day for nonpregnant women and 2 drinks a day for men. One drink equals 12 oz of beer, 5 oz of wine, or 1 oz of hard liquor. Lifestyle  Work with your doctor to  stay at a healthy weight or to lose weight. Ask your doctor what the best weight is for you.  Get at least 30 minutes of exercise that causes your heart to beat faster (aerobic exercise) most days of the week. This may include walking, swimming, or biking.  Get at least 30 minutes of exercise that strengthens your muscles (resistance exercise) at least 3 days a week. This may include lifting weights or pilates.  Do not use any products that contain nicotine or tobacco. This includes cigarettes and e-cigarettes. If you need help quitting, ask your doctor.  Check your blood pressure at home as told by your doctor.  Keep all follow-up visits as told by your doctor. This is important. Medicines  Take over-the-counter and prescription medicines only as told by your doctor. Follow directions carefully.  Do not skip doses of blood pressure medicine. The medicine does not work as well if you skip doses. Skipping doses also puts you at risk for problems.  Ask your doctor about side effects or reactions to medicines that you should watch for. Contact a doctor if:  You think you are having a reaction to the medicine you are taking.  You have headaches that keep coming back (recurring).  You feel dizzy.  You have swelling in your ankles.  You have trouble with your vision. Get help right away if:  You get a very bad headache.  You start to feel confused.  You feel weak or numb.  You feel faint.  You get very bad  pain in your: ? Chest. ? Belly (abdomen).  You throw up (vomit) more than once.  You have trouble breathing. Summary  Hypertension is another name for high blood pressure.  Making healthy choices can help lower blood pressure. If your blood pressure cannot be controlled with healthy choices, you may need to take medicine. This information is not intended to replace advice given to you by your health care provider. Make sure you discuss any questions you have with your  health care provider. Document Released: 05/24/2008 Document Revised: 11/03/2016 Document Reviewed: 11/03/2016 Elsevier Interactive Patient Education  [employer]2018 [employer]

## 2018-01-31 LAB — COMPREHENSIVE METABOLIC PANEL
A/G RATIO: 1.3 (ref 1.2–2.2)
ALT: 10 IU/L (ref 0–32)
AST: 10 IU/L (ref 0–40)
Albumin: 4.2 g/dL (ref 3.5–5.5)
Alkaline Phosphatase: 64 IU/L (ref 39–117)
BUN/Creatinine Ratio: 17 (ref 9–23)
BUN: 13 mg/dL (ref 6–20)
Bilirubin Total: 0.7 mg/dL (ref 0.0–1.2)
CALCIUM: 9.9 mg/dL (ref 8.7–10.2)
CO2: 25 mmol/L (ref 20–29)
CREATININE: 0.78 mg/dL (ref 0.57–1.00)
Chloride: 101 mmol/L (ref 96–106)
GFR, EST AFRICAN AMERICAN: 111 mL/min/{1.73_m2} (ref >59)
GFR, EST NON AFRICAN AMERICAN: 96 mL/min/{1.73_m2} (ref >59)
Globulin, Total: 3.2 g/dL (ref 1.5–4.5)
Glucose: 121 mg/dL — ABNORMAL HIGH (ref 65–99)
Potassium: 4.5 mmol/L (ref 3.5–5.2)
Sodium: 141 mmol/L (ref 134–144)
TOTAL PROTEIN: 7.4 g/dL (ref 6.0–8.5)

## 2018-01-31 LAB — CBC WITH DIFFERENTIAL/PLATELET
BASOS ABS: 0 10*3/uL (ref 0.0–0.2)
BASOS: 0 %
EOS (ABSOLUTE): 0.1 10*3/uL (ref 0.0–0.4)
Eos: 2 %
Hematocrit: 37.7 % (ref 34.0–46.6)
Hemoglobin: 12.5 g/dL (ref 11.1–15.9)
IMMATURE GRANS (ABS): 0 10*3/uL (ref 0.0–0.1)
IMMATURE GRANULOCYTES: 0 %
LYMPHS: 39 %
Lymphocytes Absolute: 2.5 10*3/uL (ref 0.7–3.1)
MCH: 27.8 pg (ref 26.6–33.0)
MCHC: 33.2 g/dL (ref 31.5–35.7)
MCV: 84 fL (ref 79–97)
Monocytes Absolute: 0.4 10*3/uL (ref 0.1–0.9)
Monocytes: 6 %
NEUTROS PCT: 53 %
Neutrophils Absolute: 3.4 10*3/uL (ref 1.4–7.0)
PLATELETS: 319 10*3/uL (ref 150–379)
RBC: 4.49 x10E6/uL (ref 3.77–5.28)
RDW: 14.9 % (ref 12.3–15.4)
WBC: 6.3 10*3/uL (ref 3.4–10.8)

## 2018-01-31 LAB — THYROID PANEL WITH TSH
FREE THYROXINE INDEX: 1.5 (ref 1.2–4.9)
T3 UPTAKE RATIO: 23 % — AB (ref 24–39)
T4 TOTAL: 6.5 ug/dL (ref 4.5–12.0)
TSH: 1.91 u[IU]/mL (ref 0.450–4.500)

## 2018-02-13 ENCOUNTER — Ambulatory Visit (INDEPENDENT_AMBULATORY_CARE_PROVIDER_SITE_OTHER): Payer: Medicaid Other | Admitting: Obstetrics and Gynecology

## 2018-02-13 ENCOUNTER — Encounter: Payer: Self-pay | Admitting: Obstetrics and Gynecology

## 2018-02-13 VITALS — BP 220/106 | HR 57 | Wt 248.9 lb

## 2018-02-13 DIAGNOSIS — R1030 Lower abdominal pain, unspecified: Secondary | ICD-10-CM | POA: Diagnosis not present

## 2018-02-13 LAB — POCT PREGNANCY, URINE: Preg Test, Ur: NEGATIVE

## 2018-02-13 NOTE — Progress Notes (Signed)
Pt concerned that something may have come a loose in stomach ,having pains in & around [employer] are. A knot under abdomen.

## 2018-02-13 NOTE — Progress Notes (Signed)
Patient ID: Latoya Strickland, female   DOB: 11/27/1976, 42 y.o.   MRN: [id number] Ms Latoya Strickland presents with c/o abd/pelvic pain for the last several weeks. Describes a knot at times near umbilicus, burning sensation at times on right side, occ vaginal pain. Not sexual active, no contraception. Cycles monthly, last 4 days cramps and clots at times. H/O c section 9 months ago. H/O HTN and DM followed by PCP Denies any bowel or bladder dysfunction  PE AF VSS Lungs clear Heart RRR Abd soft, obese no masses or tenderness Bimanual exam uterus small mobile non tender, no masses limited by pt habitus  A/P Abd/Pelvic pain  Doubt GYN in origin. UPT negative today. Will check U/S. Information on IUD's provided to pt. Continue follow up for HTM/DM with PCP. F/U per test results or desire for IUD placement

## 2018-02-15 LAB — URINE CULTURE

## 2018-02-21 ENCOUNTER — Ambulatory Visit (HOSPITAL_COMMUNITY)
Admission: RE | Admit: 2018-02-21 | Discharge: 2018-02-21 | Disposition: A | Discharge status: Home / Self Care | Payer: Medicaid Other | Source: Ambulatory Visit | Attending: Obstetrics and Gynecology | Admitting: Obstetrics and Gynecology

## 2018-02-21 DIAGNOSIS — R1030 Lower abdominal pain, unspecified: Secondary | ICD-10-CM | POA: Insufficient documentation

## 2018-03-09 ENCOUNTER — Telehealth: Payer: Self-pay | Admitting: General Practice

## 2018-03-09 NOTE — Telephone Encounter (Signed)
Called patient, no answer- left message on voicemail to call us back for non urgent results. Will send letter

## 2018-03-09 NOTE — Telephone Encounter (Signed)
-----   Message from Hermina StaggersMichael L Ervin, MD sent at 02/21/2018  1:34 PM EST ----- Please let Ms Latoya JunesBrandon that her GYN U/S was normal. I am unable to find a GYN cause for her pain. If continues to have pain she needs to see her PCP. Thanks Casimiro NeedleMichael

## 2018-03-10 ENCOUNTER — Telehealth: Payer: Self-pay | Admitting: General Practice

## 2018-03-10 NOTE — Telephone Encounter (Signed)
Patient called into front office requesting test results. Informed patient of results. Patient states she is still having pain in her abdomen. Recommended she follow up with PCP. Patient verbalized understanding & had no questions

## 2018-03-13 ENCOUNTER — Ambulatory Visit: Payer: Self-pay | Admitting: Family Medicine

## 2018-06-29 IMAGING — US US MFM OB TRANSVAGINAL
1 series · 14 of 28 positions shown · non-contrast
Comparison: none

[Series 1: us mfm ob transvaginal · 71 acquisitions, 14 frames shown]
[im 3/71]
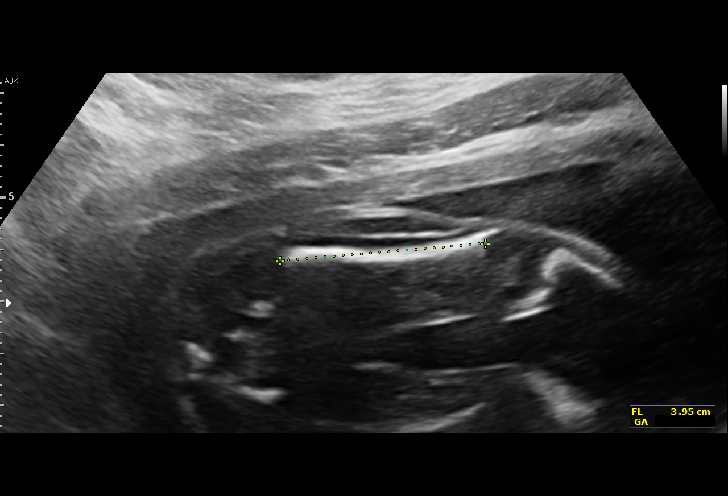
[im 8/71]
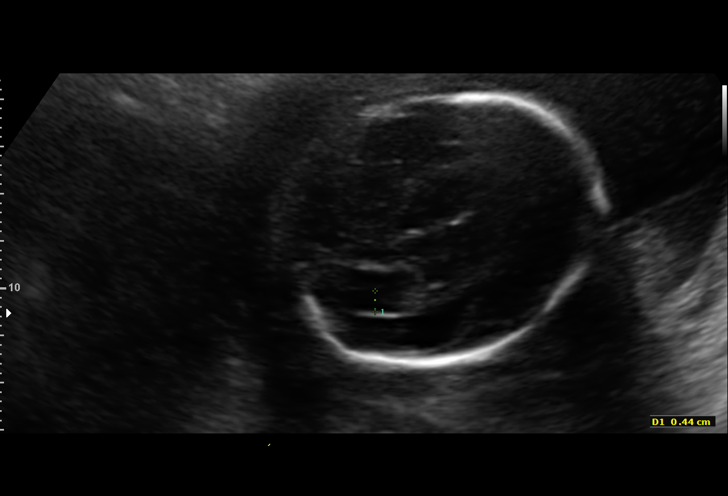
[im 13/71]
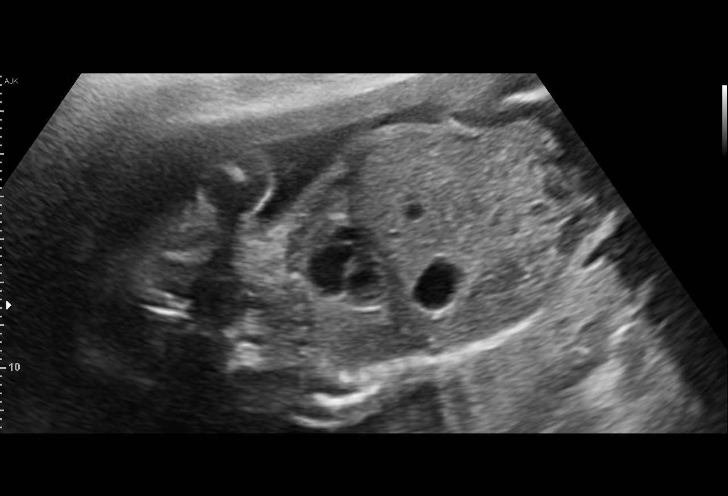
[im 19/71]
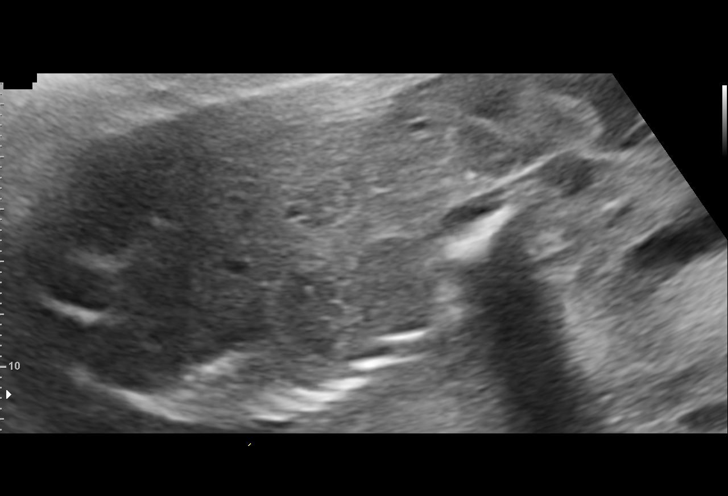
[im 24/71]
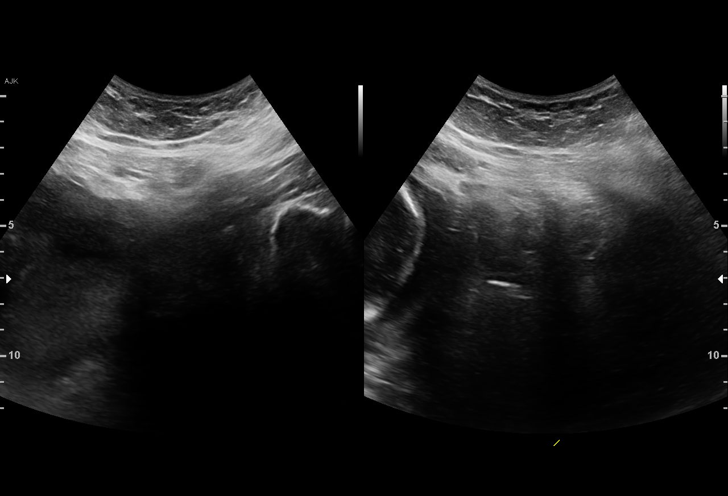
[im 29/71]
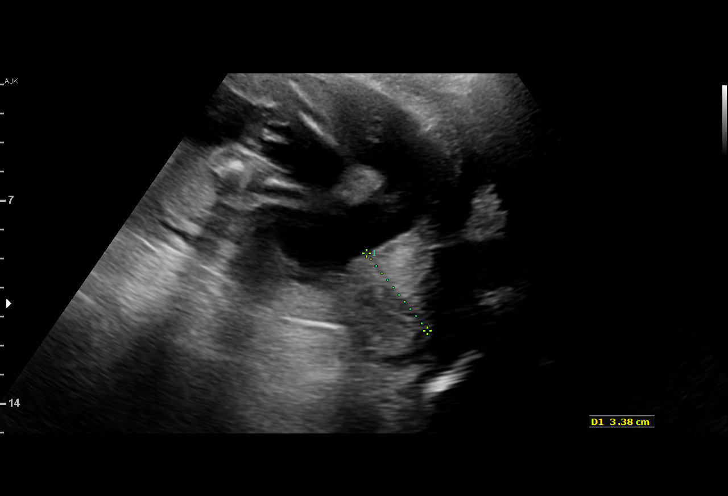
[im 34/71]
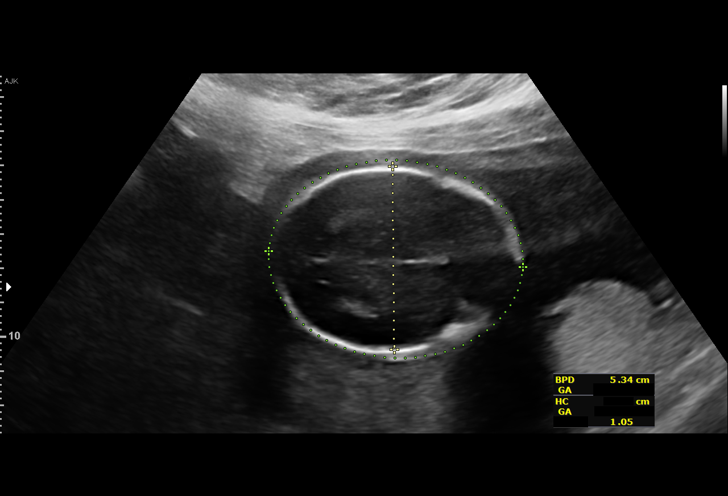
[im 39/71]
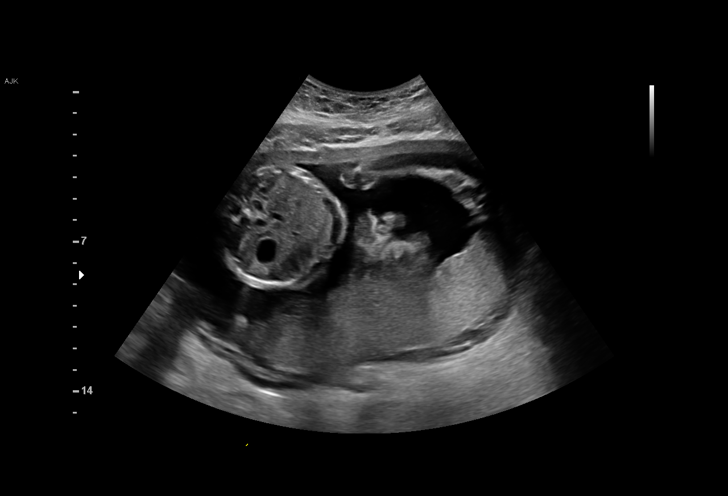
[im 45/71]
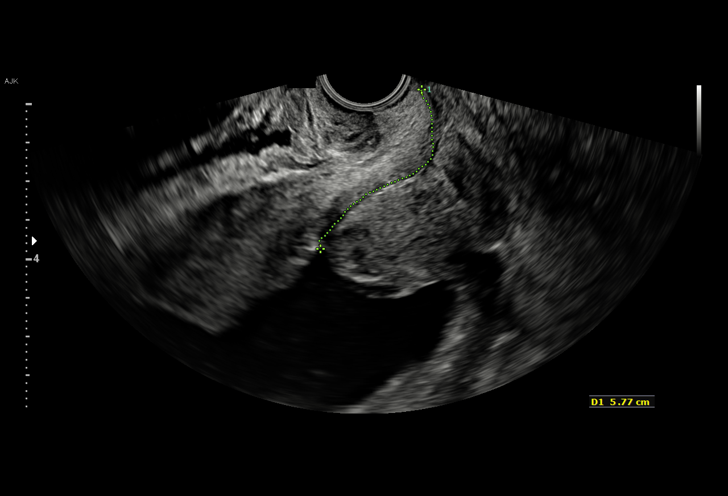
[im 50/71]
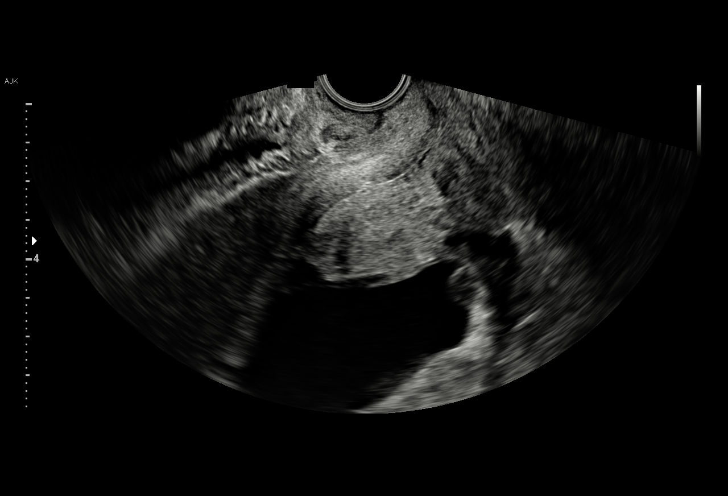
[im 55/71]
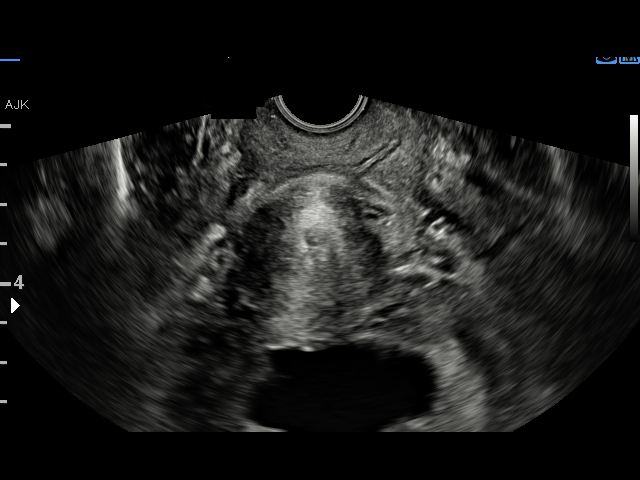
[im 60/71]
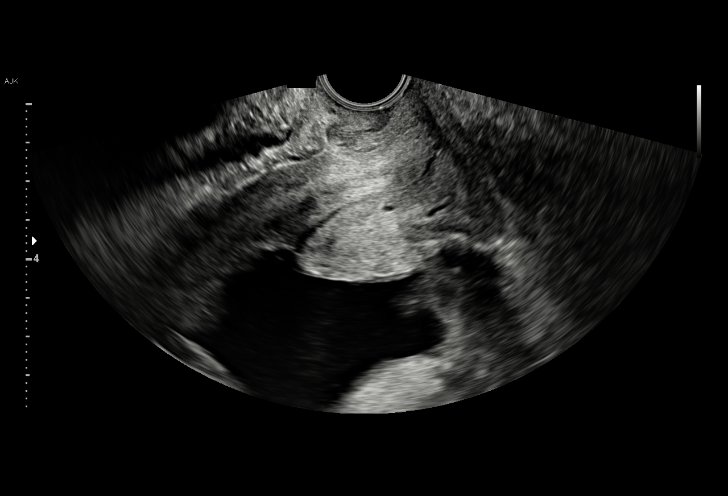
[im 65/71]
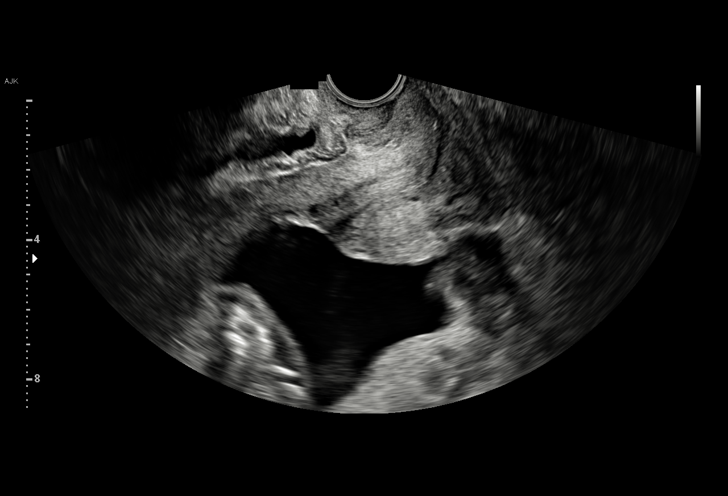
[im 71/71]
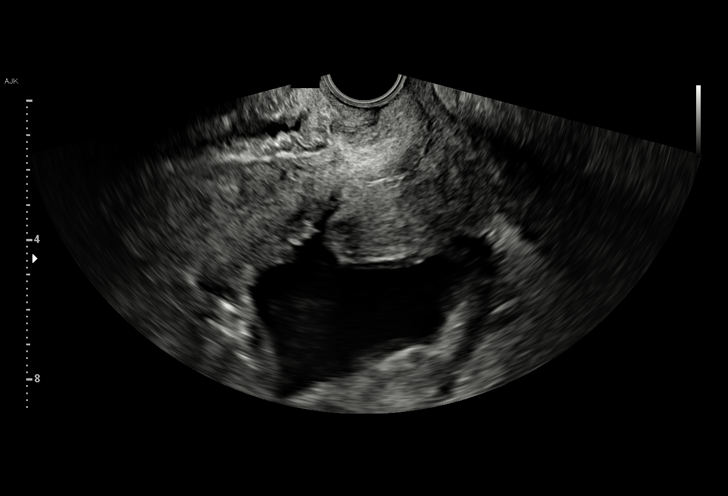

[14 of 28 positions shown; findings below may reference images not displayed]

pm)


1  NII ODOI REIGN            [id number]      [id number]     [id number]
2  NII ODOI REIGN            [id number]      [id number]     [id number]
Indications

22 weeks gestation of pregnancy
Hypertension - Chronic/Pre-existing
(Procardia)
Diabetes - Pregestational, 2nd trimester
(glyburide) (A1c [DATE] 7.0)
Advanced maternal age primigravida 35+,
second trimester; declined testing
Obesity complicating pregnancy, second
trimester
OB History

Blood Type:            Height:  5'10"  Weight (lb):  261       BMI:
Gravidity:    1         Term:   0        Prem:   0        SAB:   0
TOP:          0       Ectopic:  0
Fetal Evaluation

Num Of Fetuses:     1
Fetal Heart         153
Rate(bpm):
Cardiac Activity:   Observed
Presentation:       Cephalic
Placenta:           Posterior, above cervical os

Amniotic Fluid
AFI FV:      Subjectively within normal limits
Largest Pocket(cm)
4.11
Biometry

BPD:      53.8  mm     G. Age:  22w 2d         62  %    CI:        68.99   %    70 - 86
FL/HC:      18.7   %    18.4 -
HC:      206.9  mm     G. Age:  22w 6d         71  %    HC/AC:      1.08        1.06 -
AC:      192.2  mm     G. Age:  23w 6d         92  %    FL/BPD:     71.7   %    71 - 87
FL:       38.6  mm     G. Age:  22w 3d         53  %    FL/AC:      20.1   %    20 - 24

Est. FW:     569  gm      1 lb 4 oz     65  %
Gestational Age

U/S Today:     22w 6d                                        EDD:   05/27/17
Best:          22w 0d     Det. By:  Previous Ultrasound      EDD:   06/02/17
(10/07/16)
Anatomy

Cranium:               Appears normal         Aortic Arch:            Appears normal
Cavum:                 Appears normal         Ductal Arch:            Not well visualized
Ventricles:            Appears normal         Diaphragm:              Appears normal
Choroid Plexus:        Appears normal         Stomach:                Appears normal, left
sided
Cerebellum:            Appears normal         Abdomen:                Appears normal
Posterior Fossa:       Appears normal         Abdominal Wall:         Appears nml (cord
insert, abd wall)
Nuchal Fold:           Appears normal         Cord Vessels:           Appears normal (3
vessel cord)
Face:                  Appears normal         Kidneys:                Appear normal
(orbits and profile)
Lips:                  Appears normal         Bladder:                Appears normal
Thoracic:              Appears normal         Spine:                  Appears normal
Heart:                 Appears normal         Upper Extremities:      Appears normal
(4CH, axis, and
situs)
RVOT:                  Appears normal         Lower Extremities:      Appears normal
LVOT:                  Appears normal

Other:  Fetus appears to be a male. Heels and 5th digit visualized. Nasal
bone visualized. Open hands visualized.
Cervix Uterus Adnexa

Cervix
Not adaquately visualized

Uterus
No abnormality visualized.

Left Ovary
Not visualized.

Right Ovary
Not visualized.

Adnexa:       No abnormality visualized.
Impression

Single IUP at 22w 0d
Chronic hypertension, type 2 diabetes
Limited views of the ductal arch obtained; the remainder of
the fetal anatomy appears normal
The estimated fetal weight is at the 65th %tile.  The AC
measures at the 92nd %tile.
Posterior placenta - the leading edge of the placenta is 
 4
cm from the internal os
Normal amniotic fluid volume
Recommendations

Fetal echo scheduled
Recommend follow up ultrasound for interval growth in 4
weeks

## 2018-08-29 IMAGING — US US MFM OB FOLLOW-UP
1 series · 13 of 28 positions shown · non-contrast
Comparison: none

1  LAAOUINA TIGER            [id number]      [id number]     [id number]
Indications

30 weeks gestation of pregnancy
Hypertension - Chronic/Pre-existing
(Procardia-refuses to take)
Diabetes - Pregestational,3rd trimester
(glyburide-refuses to take)(A1c [DATE]
7.0)(missed fetal echo appt [DATE])
Advanced maternal age primigravida 35+,
third trimester, declined testing
Obesity complicating pregnancy, third
trimester
OB History
Blood Type:            Height:  5'10"  Weight (lb):  261      BMI:
Gravidity:    1         Term:   0        Prem:   0        SAB:   0
TOP:          0       Ectopic:  0
Fetal Evaluation
Num Of Fetuses:     1
Fetal Heart         141
Rate(bpm):
Cardiac Activity:   Observed
Presentation:       Cephalic
Placenta:           Posterior, above cervical os
P. Cord Insertion:  Previously Visualized
Amniotic Fluid
AFI FV:      Subjectively within normal limits
AFI Sum(cm)     %Tile       Largest Pocket(cm)
10.86           21          4
RUQ(cm)       RLQ(cm)       LUQ(cm)        LLQ(cm)
3.47          0             4
Biometry
BPD:      74.9  mm     G. Age:  30w 0d         20  %    CI:        68.81   %   70 - 86
FL/HC:      20.5   %   19.3 -
HC:      288.5  mm     G. Age:  31w 5d         43  %    HC/AC:      0.95       0.96 -
AC:      302.3  mm     G. Age:  34w 2d       > 97  %    FL/BPD:     79.0   %   71 - 87
FL:       59.2  mm     G. Age:  30w 6d         40  %    FL/AC:      19.6   %   20 - 24
HUM:        54  mm     G. Age:  31w 3d         67  %
Est. FW:    2211  gm      4 lb 7 oz     82  %
Gestational Age
U/S Today:     31w 5d                                        EDD:   05/26/17
Best:          30w 5d    Det. By:   Previous Ultrasound      EDD:   06/02/17
(10/07/16)
Anatomy
Cranium:               Appears normal         Aortic Arch:            Previously seen
Cavum:                 Previously seen        Ductal Arch:            Previously seen
Ventricles:            Previously seen        Diaphragm:              Previously seen
Choroid Plexus:        Previously seen        Stomach:                Appears normal, left
sided
Cerebellum:            Previously seen        Abdomen:                Previously seen
Posterior Fossa:       Previously seen        Abdominal Wall:         Previously seen
Nuchal Fold:           Previously seen        Cord Vessels:           Previously seen
Face:                  Orbits and profile     Kidneys:                Appear normal
previously seen
Lips:                  Previously seen        Bladder:                Appears normal
Thoracic:              Appears normal         Spine:                  Previously seen
Heart:                 Appears normal         Upper Extremities:      Previously seen
(4CH, axis, and situs
RVOT:                  Previously seen        Lower Extremities:      Previously seen
LVOT:                  Previously seen
Other:  Fetus appears to be a male. Heels and 5th digit previously visualized.
Nasal bone previously visualized. Open hands previously visualized.
Cervix Uterus Adnexa
Cervix
Not visualized (advanced GA >29wks)
Uterus
No abnormality visualized.
Left Ovary
No adnexal mass visualized.
Right Ovary
Cul De Sac:   No free fluid seen.
Adnexa:       No abnormality visualized.
Impression
INDICATION: 40 yr old G1P0 at 87w3d with type II diabetes and
chronic hypertension for fetal growth.

[Series 1: us mfm ob follow-up · 34 acquisitions, 13 frames shown]
[im 2/34]
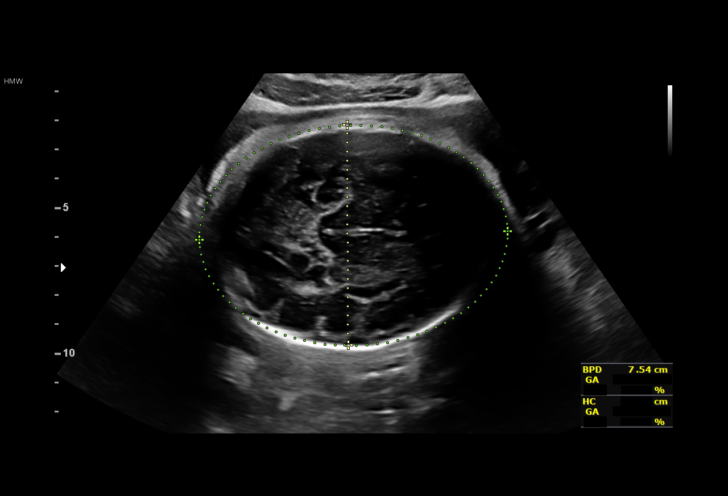
[im 4/34]
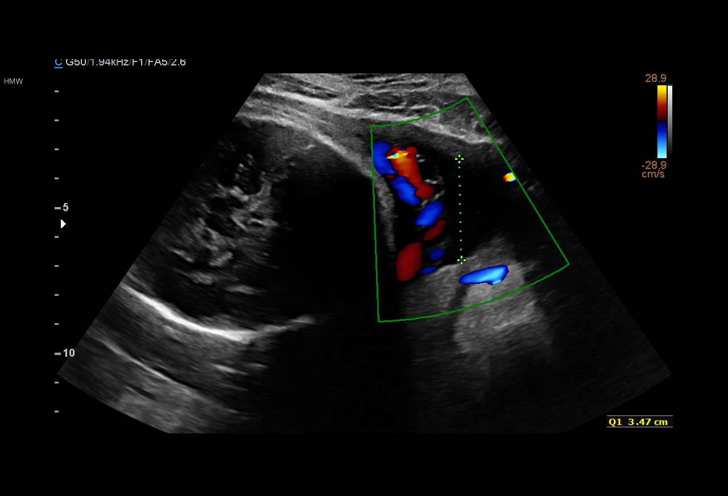
[im 7/34]
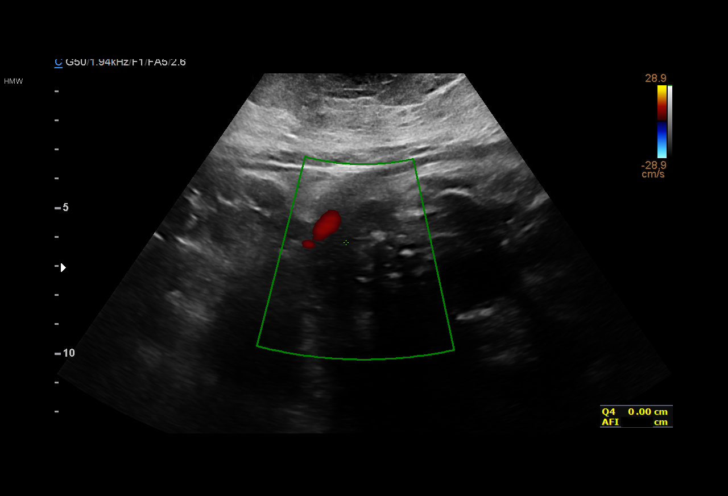
[im 9/34]
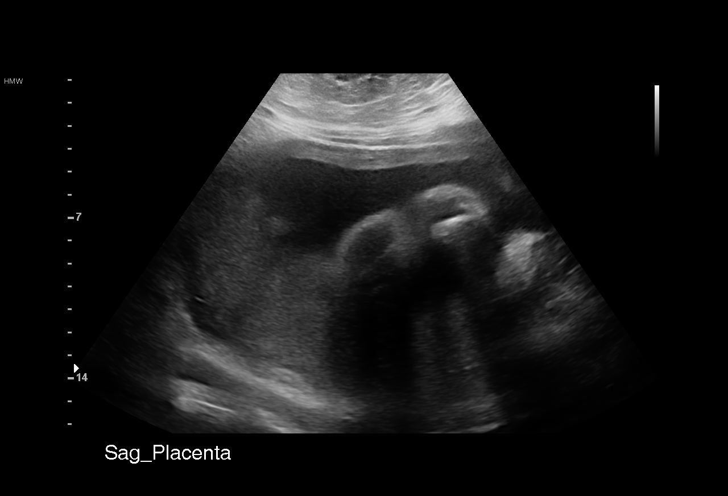
[im 12/34]
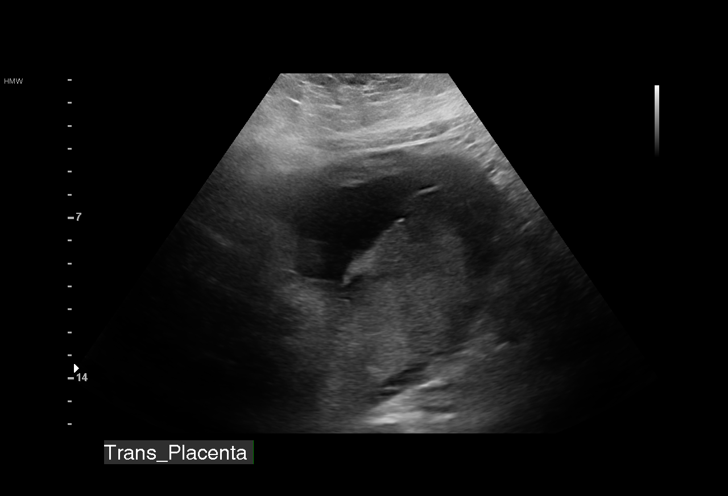
[im 14/34]
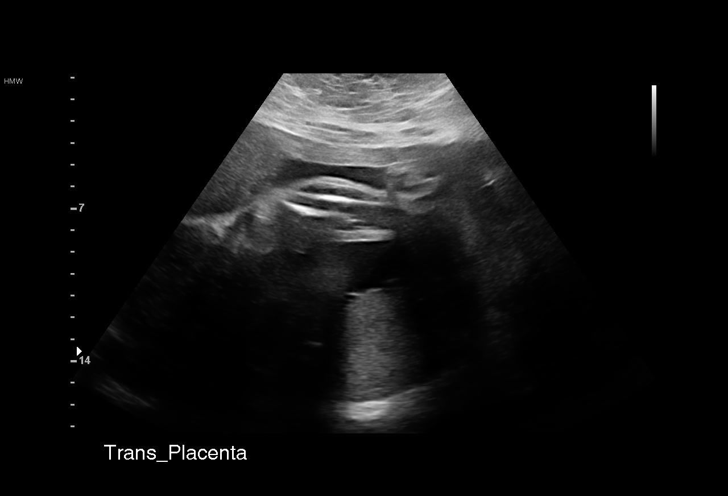
[im 18/34]
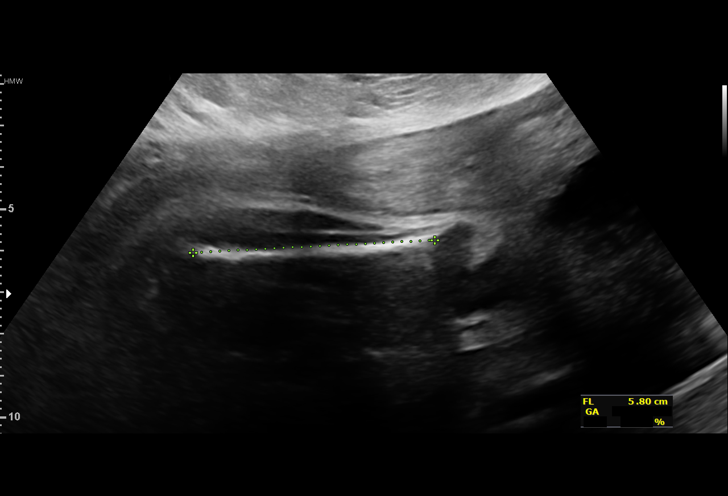
[im 20/34]
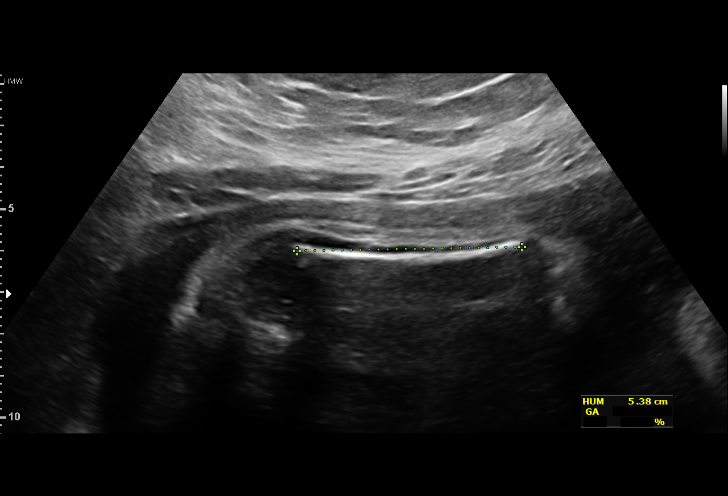
[im 23/34]
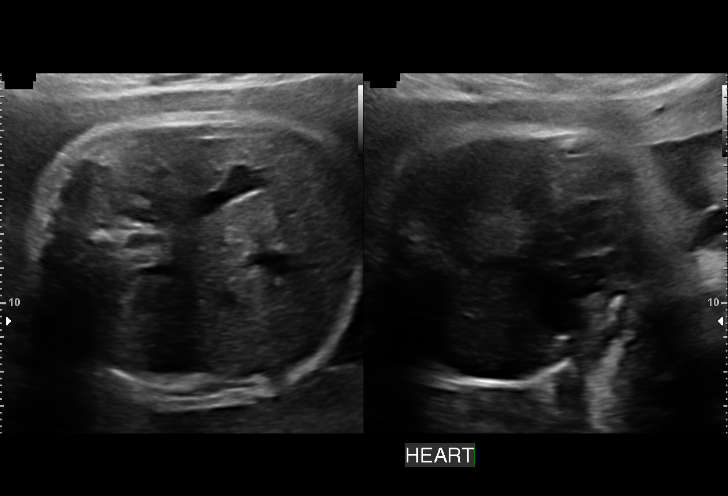
[im 25/34]
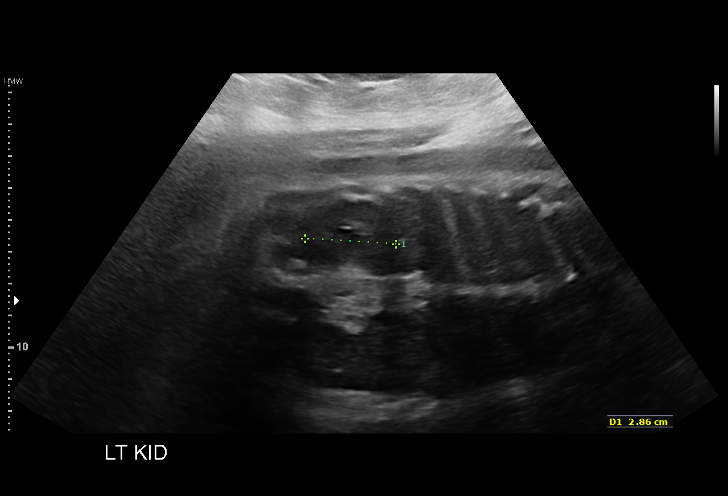
[im 27/34]
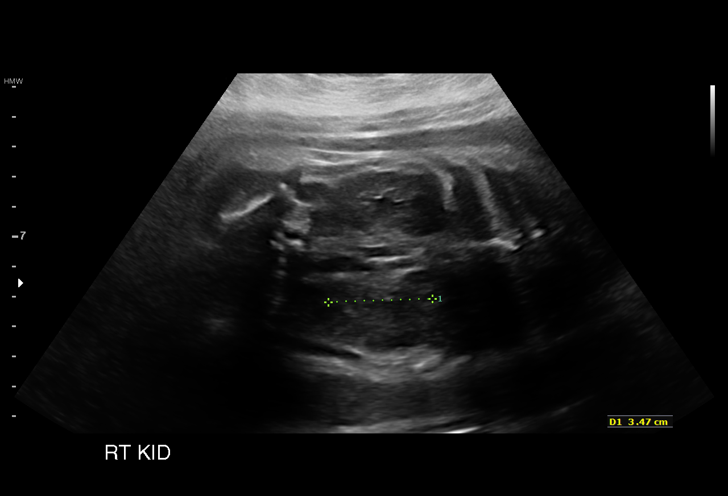
[im 30/34]
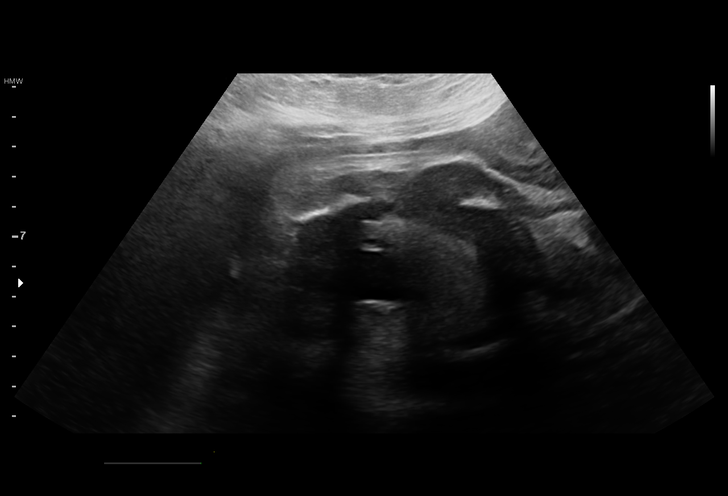
[im 32/34]
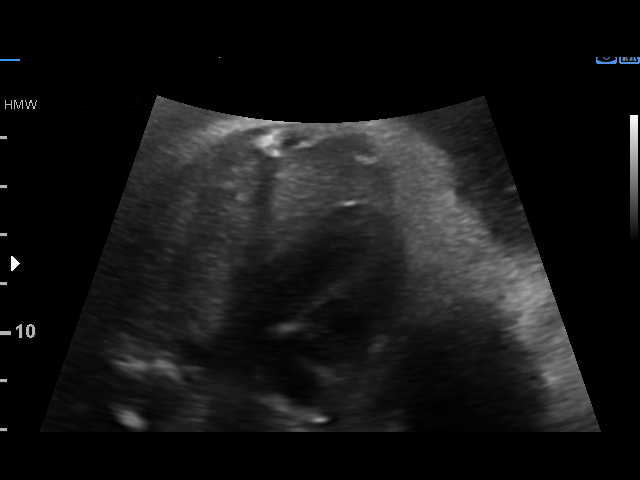

[13 of 28 positions shown; findings below may reference images not displayed]

FINDINGS: 1. Single intrauterine pregnancy.
2. Estimated fetal weight is in the 82nd%.
3. Posterior placenta without evidence of previa.
4. Normal amniotic fluid index.
5. The limited anatomy survey is normal.
Recommendations

1. Appropriate fetal growth.
2. Hypertension:
- patient has been noncompliant and has not taken her
prescribed procardia
- patient has had severely elevated BPs for the entire
pregnancy
- BP today is 205/125; 206/119
- I explained to the patient the risk of stroke, IUFD, abruption,
eclampsia, and death with these uncontrolled blood pressures
- I recommended the patient go to [facility] for immediate
treatment and lowering of blood pressure and evaluation for
preeclampsia- patient was in agreement and went to [facility]
- I reiterated importance of compliance with the patient and
risks with noncompliance
- fetal growth every 4 weeks
- start antenatal testing at 32 weeks
- delivery timing based on clinical scenario
- monitor closely for signs/symptoms of preeclampsia
2. Diabetes:
- previously counseled
- noncompliant with glyburide
- patient declined fetal echocardiogram
- fetal surveillance as above
3. Advanced maternal age:
- previously counseled
- declined aneuploidy screening

I spoke with Dr. Leija to let him know about the blood
pressures and my recommendations for patient to go to [facility].
Please consult us further if further recommendations are
desired.

## 2018-10-11 ENCOUNTER — Ambulatory Visit (HOSPITAL_COMMUNITY)
Admission: EM | Admit: 2018-10-11 | Discharge: 2018-10-11 | Discharge status: Against Medical Advice | Payer: Medicaid Other

## 2018-10-11 NOTE — ED Notes (Signed)
Pt left because she had been here for two hours and several patients had been called back before her.  Pt was upset, but decided to LWBS before triage.

## 2019-03-28 ENCOUNTER — Encounter: Payer: Self-pay | Admitting: *Deleted

## 2019-05-01 ENCOUNTER — Ambulatory Visit (HOSPITAL_COMMUNITY)
Admission: EM | Admit: 2019-05-01 | Discharge: 2019-05-01 | Discharge status: Against Medical Advice | Payer: Medicaid Other | Attending: Family Medicine | Admitting: Family Medicine

## 2019-05-01 ENCOUNTER — Other Ambulatory Visit: Payer: Self-pay

## 2019-05-01 DIAGNOSIS — R0789 Other chest pain: Secondary | ICD-10-CM | POA: Diagnosis not present

## 2019-05-01 DIAGNOSIS — R079 Chest pain, unspecified: Secondary | ICD-10-CM

## 2019-05-01 DIAGNOSIS — M79602 Pain in left arm: Secondary | ICD-10-CM | POA: Diagnosis not present

## 2019-05-01 NOTE — ED Provider Notes (Addendum)
Patient presented to the urgent care center with a chief complaint of chest pain, and pain in her left arm.  She states she been having symptoms for a week.  Getting worse.  She told the nurse that she was here to make sure she was not having a heart attack or stroke. In reviewing the chart, her coronary risk factors are; diabetes, cigarette smoking, essential hypertension, untreated hyperlipidemia. An EKG was done emergently.  Compared with prior EKG of February 2019.  Patient has ST and T wave changes that the computer read as early repolarization but looks like they could be certainly acute findings.  EKG is changed.  I discussed with the patient that she needed to be transported promptly down to the emergency room.  We offered to take her down in an wheelchair or to call EMS for her.  Patient states that "I am not comfortable going to [facility], ER".  She indicated she wished to go to [place].  I advised against her getting in a car and drive across town, with potential heart instability.  The patient became agitated, and left the urgent care center AGAINST MEDICAL ADVICE.  Refused to sign AMA forms.   Eustace Moore, MD 05/01/19 1345    Eustace Moore, MD 05/01/19 1348

## 2019-05-01 NOTE — ED Notes (Addendum)
Patient here for chest pain and L arm pain, symptoms x1 week, states her arm is hurting and she "wants to be sure its not a heart attack or stroke". Pt c/o arm pain at this time. Ambulatory with steady gait. EKG obtained at 1323, given to Dr. Delton See, Dr. Delton See reviewed, shows EKG changes. Dr. Delton See spoke with patient, patient needs immediate evaluation in the ER. Patient given the option to go by EMS or have staff wheel her down, patient refused, patient states she wanted to drive herself to [place]. Per Dr. Delton See if patient decides to leave by private vehicle, it will be AGAINST MEDICAL ADVICE. Patient informed risks of leaving, up to and including death if she is not immediately evaluated. Patient refused to sign AMA paperwork and left the building.

## 2019-05-21 ENCOUNTER — Encounter: Payer: Self-pay | Admitting: Cardiology

## 2019-05-21 ENCOUNTER — Other Ambulatory Visit: Payer: Self-pay

## 2019-05-21 ENCOUNTER — Ambulatory Visit (INDEPENDENT_AMBULATORY_CARE_PROVIDER_SITE_OTHER): Payer: Medicaid Other | Admitting: Cardiology

## 2019-05-21 VITALS — BP 218/125 | HR 82 | Ht 70.0 in | Wt 246.8 lb

## 2019-05-21 DIAGNOSIS — I10 Essential (primary) hypertension: Secondary | ICD-10-CM

## 2019-05-21 DIAGNOSIS — IMO0002 Reserved for concepts with insufficient information to code with codable children: Secondary | ICD-10-CM

## 2019-05-21 DIAGNOSIS — E1122 Type 2 diabetes mellitus with diabetic chronic kidney disease: Secondary | ICD-10-CM

## 2019-05-21 DIAGNOSIS — R9431 Abnormal electrocardiogram [ECG] [EKG]: Secondary | ICD-10-CM

## 2019-05-21 DIAGNOSIS — N182 Chronic kidney disease, stage 2 (mild): Secondary | ICD-10-CM

## 2019-05-21 DIAGNOSIS — I129 Hypertensive chronic kidney disease with stage 1 through stage 4 chronic kidney disease, or unspecified chronic kidney disease: Secondary | ICD-10-CM

## 2019-05-21 DIAGNOSIS — R0789 Other chest pain: Secondary | ICD-10-CM

## 2019-05-21 DIAGNOSIS — R079 Chest pain, unspecified: Secondary | ICD-10-CM | POA: Insufficient documentation

## 2019-05-21 DIAGNOSIS — E1165 Type 2 diabetes mellitus with hyperglycemia: Secondary | ICD-10-CM

## 2019-05-21 MED ORDER — METOPROLOL SUCCINATE ER 50 MG PO TB24: 50.0000 mg | ORAL_TABLET | Freq: Every day | ORAL | 1 refills | Status: DC

## 2019-05-21 MED ORDER — AMLODIPINE BESYLATE 10 MG PO TABS: 10.0000 mg | ORAL_TABLET | Freq: Every day | ORAL | 1 refills | Status: DC

## 2019-05-21 NOTE — Progress Notes (Signed)
Primary Physician:  Scot Jun, FNP   Patient ID: Latoya Strickland, female    DOB: Jan 30, 1976, 43 y.o.   MRN: [id number]  Subjective:    Chief Complaint  Patient presents with  . New Patient (Initial Visit)    left arm pain    HPI: Latoya Strickland  is a 43 y.o. female  with hypertension, PCOS, DJD, presents today for evaluation of chest pain.  Patient was seen at [facility] on 05/01/2019 for chest pain associated with left arm radiation. She was noted to have EKG changes concerning for acute changes. Patient was advised that she needed to be seen in ER for further workup; however, patient did not feel comfortable going to ER and left AMA. She has continued to have intermittent chest pain that she feels radiates from her left arm. Certain positions can exacerbate her symptoms and also alleviate; however, also has episodes just sitting resting. She has noticed that ice to her chest helps. She has had previous episodes of chest pain with walking, but not recently. She does not take her medications regularly as she states that she feels better without them.   She has history of diabetes; however, also does not take her medications for this as she does not believe that she truly has it. Denies any history of hyperlipidemia.   She is former tobacco user, quit around age 89. She has a 80 year old baby boy. Had preeclampsia with pregnancy. No alcohol use. She does not exercise, states that she is mostly sedentary.   Past Medical History:  Diagnosis Date  . Arthritis   . Blood transfusion without reported diagnosis   . Carpal tunnel syndrome   . Degenerative joint disease   . Diabetes mellitus without complication ([place])   . DJD (degenerative joint disease)   . Hypertension   . Lumbar herniated disc   . PCOS (polycystic ovarian syndrome)     Past Surgical History:  Procedure Laterality Date  . CESAREAN SECTION    . WISDOM TOOTH EXTRACTION      Social History    Socioeconomic History  . Marital status: Significant Other    Spouse name: Not on file  . Number of children: 1  . Years of education: Not on file  . Highest education level: Not on file  Occupational History  . Not on file  Social Needs  . Financial resource strain: Not on file  . Food insecurity:    Worry: Not on file    Inability: Not on file  . Transportation needs:    Medical: Not on file    Non-medical: Not on file  Tobacco Use  . Smoking status: Never Smoker  . Smokeless tobacco: Never Used  Substance and Sexual Activity  . Alcohol use: No  . Drug use: Yes    Types: Marijuana    Comment: 01/11/17 +THC  . Sexual activity: Yes    Birth control/protection: None  Lifestyle  . Physical activity:    Days per week: Not on file    Minutes per session: Not on file  . Stress: Not on file  Relationships  . Social connections:    Talks on phone: Not on file    Gets together: Not on file    Attends religious service: Not on file    Active member of club or organization: Not on file    Attends meetings of clubs or organizations: Not on file    Relationship status: Not on file  .  Intimate partner violence:    Fear of current or ex partner: Not on file    Emotionally abused: Not on file    Physically abused: Not on file    Forced sexual activity: Not on file  Other Topics Concern  . Not on file  Social History Narrative  . Not on file    Review of Systems  Constitution: Negative for decreased appetite, malaise/fatigue, weight gain and weight loss.  Eyes: Negative for visual disturbance.  Cardiovascular: Positive for chest pain (left arm pain). Negative for claudication, dyspnea on exertion, leg swelling, orthopnea, palpitations and syncope.  Respiratory: Negative for hemoptysis and wheezing.   Endocrine: Negative for cold intolerance and heat intolerance.  Hematologic/Lymphatic: Does not bruise/bleed easily.  Skin: Negative for nail changes.  Musculoskeletal: Negative  for muscle weakness and myalgias.  Gastrointestinal: Negative for abdominal pain, change in bowel habit, nausea and vomiting.  Neurological: Negative for difficulty with concentration, dizziness, focal weakness and headaches.  Psychiatric/Behavioral: Negative for altered mental status and suicidal ideas.  All other systems reviewed and are negative.     Objective:  Blood pressure (!) 218/125, pulse 82, height 5' 10"  (1.778 m), weight 246 lb 12.8 oz (111.9 kg), SpO2 97 %. Body mass index is 35.41 kg/m.    Physical Exam  Constitutional: She is oriented to person, place, and time. Vital signs are normal. She appears well-developed and well-nourished.  HENT:  Head: Normocephalic and atraumatic.  Neck: Normal range of motion.  Cardiovascular: Normal rate, regular rhythm and intact distal pulses. Exam reveals gallop and S4.  Pulmonary/Chest: Effort normal and breath sounds normal. No accessory muscle usage. No respiratory distress.  Abdominal: Soft. Bowel sounds are normal.  Musculoskeletal: Normal range of motion.  Neurological: She is alert and oriented to person, place, and time.  Skin: Skin is warm and dry.  Vitals reviewed.  Radiology: No results found.  Laboratory examination:    CMP Latest Ref Rng & Units 01/30/2018 05/06/2017 04/07/2017  Glucose 65 - 99 mg/dL 121(H) 146(H) 100(H)  BUN 6 - 20 mg/dL 13 12 8   Creatinine 0.57 - 1.00 mg/dL 0.78 0.82 0.72  Sodium 134 - 144 mmol/L 141 133(L) 136  Potassium 3.5 - 5.2 mmol/L 4.5 3.8 4.2  Chloride 96 - 106 mmol/L 101 102 107  CO2 20 - 29 mmol/L 25 24 22   Calcium 8.7 - 10.2 mg/dL 9.9 8.5(L) 8.5(L)  Total Protein 6.0 - 8.5 g/dL 7.4 5.3(L) 6.1(L)  Total Bilirubin 0.0 - 1.2 mg/dL 0.7 0.8 1.0  Alkaline Phos 39 - 117 IU/L 64 100 66  AST 0 - 40 IU/L 10 17 15   ALT 0 - 32 IU/L 10 10(L) 11(L)   CBC Latest Ref Rng & Units 01/30/2018 05/06/2017 04/07/2017  WBC 3.4 - 10.8 x10E3/uL 6.3 7.3 7.8  Hemoglobin 11.1 - 15.9 g/dL 12.5 12.1 11.1(L)   Hematocrit 34.0 - 46.6 % 37.7 34.3(L) 31.6(L)  Platelets 150 - 379 x10E3/uL 319 222 221   Lipid Panel     Component Value Date/Time   CHOL 182 05/03/2016 1506   TRIG 71 05/03/2016 1506   HDL 50 05/03/2016 1506   CHOLHDL 3.6 05/03/2016 1506   VLDL 14 05/03/2016 1506   LDLCALC 118 05/03/2016 1506   HEMOGLOBIN A1C Lab Results  Component Value Date   HGBA1C 6.5 01/30/2018   MPG 151 04/07/2017   TSH No results for input(s): TSH in the last 8760 hours.  PRN Meds:. There are no discontinued medications. Current Meds  Medication Sig  .  acetaminophen (TYLENOL) 500 MG tablet Take 500 mg by mouth every 6 (six) hours as needed.  Marland Kitchen aspirin 325 MG EC tablet Take 325 mg by mouth as needed for pain.  . Blood Glucose Monitoring Suppl (ACCU-CHEK NANO SMARTVIEW) w/Device KIT 1 kit by Subdermal route as directed. Check blood sugars for fasting, and two hours after breakfast, lunch and dinner (4 checks daily)    Cardiac Studies:     Assessment:   Essential hypertension - Plan: EKG 12-Lead  Atypical chest pain - Plan: EKG 12-Lead  Uncontrolled type 2 diabetes with stage 2 chronic kidney disease ([place])  EKG 05/21/2019: Normal sinus rhythm at 82 bpm, normal axis, PRWP, cannot exclude anterior infarct old. LVH with liekly repolarization abnormality, cannot exclude ischemia. Abnormal EKG.   Recommendations:   Patient with uncontrolled type 2 diabetes and uncontrolled hypertension presents for evaluation of chest pain and left arm pain. Patients symptoms are clearly suggestive of musculoskeletal etiology in that they are exacerbated and alleviated by position changes. Not associated with exertion. I have encouraged her to take ibuprofen/ tylenol as needed as well as continuing to use heat/ice to her chest wall. Blood pressure is markedly elevated as patient does not take her medications. I have stressed the importance of compliance with her medications and long term risk of uncontrolled  hypertension including CKD, CVA, etc. She verbalized understanding. I will start her on amlodipine 10 mg daily along with Metoprolol 50 mg daily. EKG is abnormal and suggestive of hypertensive heart disease. I have recommended echocardiogram to exclude any structural abnormalities. She does have S4 present on exam, otherwise physical exam is unremarkable except for obesity.   Discussed the importance of weight loss to help with controlling her hypertension and diabetes. She will follow up with her PCP regarding her diabetes. I will see her back in 3 weeks for follow up on hypertension.    *I have discussed this case with Dr. Einar Gip and he personally examined the patient and participated in formulating the plan.*   Miquel Dunn, MSN, APRN, FNP-C [facility]. [place] Office: [phone] Fax: [phone]

## 2019-06-11 ENCOUNTER — Ambulatory Visit: Payer: Medicaid Other | Admitting: Cardiology

## 2019-06-11 NOTE — Progress Notes (Deleted)
Primary Physician:  Bing NeighborsHarris, Kimberly S, FNP   Patient ID: Latoya Strickland, female    DOB: 04/11/1976, 43 y.o.   MRN: [id number]  Subjective:    No chief complaint on file.   HPI: Latoya Graterngela Strickland  is a 43 y.o. female  with hypertension, PCOS, DJD, presents today for evaluation of chest pain.  [facility]2019 for chest pain associated with left arm radiation. She was noted to have EKG changes concerning for acute changes. Patient was advised that she needed to be seen in ER for further workup; however, patient did not feel comfortable going to ER and left AMA. She has continued to have intermittent chest pain that she feels radiates from her left arm. Certain positions can exacerbate her symptoms and also alleviate; however, also has episodes just sitting resting. She has noticed that ice to her chest helps. She has had previous episodes of chest pain with walking, but not recently. She does not take her medications regularly as she states that she feels better without them.   She has history of diabetes; however, also does not take her medications for this as she does not believe that she truly has it. Denies any history of hyperlipidemia.   She is former tobacco user, quit around age 43. She has a 41 year old baby boy. Had preeclampsia with pregnancy. No alcohol use. She does not exercise, states that she is mostly sedentary.   Past Medical History:  Diagnosis Date  . Arthritis   . Blood transfusion without reported diagnosis   . Carpal tunnel syndrome   . Degenerative joint disease   . Diabetes mellitus without complication ([facility])   . DJD (degenerative joint disease)   . Hypertension   . Lumbar herniated disc   . PCOS (polycystic ovarian syndrome)     Past Surgical History:  Procedure Laterality Date  . CESAREAN SECTION    . WISDOM TOOTH EXTRACTION      Social History   Socioeconomic History  . Marital status: Significant Other    Spouse  name: Not on file  . Number of children: 1  . Years of education: Not on file  . Highest education level: Not on file  Occupational History  . Not on file  Social Needs  . Financial resource strain: Not on file  . Food insecurity    Worry: Not on file    Inability: Not on file  . Transportation needs    Medical: Not on file    Non-medical: Not on file  Tobacco Use  . Smoking status: Never Smoker  . Smokeless tobacco: Never Used  Substance and Sexual Activity  . Alcohol use: No  . Drug use: Yes    Types: Marijuana    Comment: 01/11/17 +THC  . Sexual activity: Yes    Birth control/protection: None  Lifestyle  . Physical activity    Days per week: Not on file    Minutes per session: Not on file  . Stress: Not on file  Relationships  . Social Musicianconnections    Talks on phone: Not on file    Gets together: Not on file    Attends religious service: Not on file    Active member of club or organization: Not on file    Attends meetings of clubs or organizations: Not on file    Relationship status: Not on file  . Intimate partner violence    Fear of current or ex partner: Not  on file    Emotionally abused: Not on file    Physically abused: Not on file    Forced sexual activity: Not on file  Other Topics Concern  . Not on file  Social History Narrative  . Not on file    Review of Systems  Constitution: Negative for decreased appetite, malaise/fatigue, weight gain and weight loss.  Eyes: Negative for visual disturbance.  Cardiovascular: Positive for chest pain (left arm pain). Negative for claudication, dyspnea on exertion, leg swelling, orthopnea, palpitations and syncope.  Respiratory: Negative for hemoptysis and wheezing.   Endocrine: Negative for cold intolerance and heat intolerance.  Hematologic/Lymphatic: Does not bruise/bleed easily.  Skin: Negative for nail changes.  Musculoskeletal: Negative for muscle weakness and myalgias.  Gastrointestinal: Negative for abdominal  pain, change in bowel habit, nausea and vomiting.  Neurological: Negative for difficulty with concentration, dizziness, focal weakness and headaches.  Psychiatric/Behavioral: Negative for altered mental status and suicidal ideas.  All other systems reviewed and are negative.     Objective:  There were no vitals taken for this visit. There is no height or weight on file to calculate BMI.    Physical Exam  Constitutional: She is oriented to person, place, and time. Vital signs are normal. She appears well-developed and well-nourished.  HENT:  Head: Normocephalic and atraumatic.  Neck: Normal range of motion.  Cardiovascular: Normal rate, regular rhythm and intact distal pulses. Exam reveals gallop and S4.  Pulmonary/Chest: Effort normal and breath sounds normal. No accessory muscle usage. No respiratory distress.  Abdominal: Soft. Bowel sounds are normal.  Musculoskeletal: Normal range of motion.  Neurological: She is alert and oriented to person, place, and time.  Skin: Skin is warm and dry.  Vitals reviewed.  Radiology: No results found.  Laboratory examination:    CMP Latest Ref Rng & Units 01/30/2018 05/06/2017 04/07/2017  Glucose 65 - 99 mg/dL 121(H) 146(H) 100(H)  BUN 6 - 20 mg/dL 13 12 8   Creatinine 0.57 - 1.00 mg/dL 0.78 0.82 0.72  Sodium 134 - 144 mmol/L 141 133(L) 136  Potassium 3.5 - 5.2 mmol/L 4.5 3.8 4.2  Chloride 96 - 106 mmol/L 101 102 107  CO2 20 - 29 mmol/L 25 24 22   Calcium 8.7 - 10.2 mg/dL 9.9 8.5(L) 8.5(L)  Total Protein 6.0 - 8.5 g/dL 7.4 5.3(L) 6.1(L)  Total Bilirubin 0.0 - 1.2 mg/dL 0.7 0.8 1.0  Alkaline Phos 39 - 117 IU/L 64 100 66  AST 0 - 40 IU/L 10 17 15   ALT 0 - 32 IU/L 10 10(L) 11(L)   CBC Latest Ref Rng & Units 01/30/2018 05/06/2017 04/07/2017  WBC 3.4 - 10.8 x10E3/uL 6.3 7.3 7.8  Hemoglobin 11.1 - 15.9 g/dL 12.5 12.1 11.1(L)  Hematocrit 34.0 - 46.6 % 37.7 34.3(L) 31.6(L)  Platelets 150 - 379 x10E3/uL 319 222 221   Lipid Panel     Component  Value Date/Time   CHOL 182 05/03/2016 1506   TRIG 71 05/03/2016 1506   HDL 50 05/03/2016 1506   CHOLHDL 3.6 05/03/2016 1506   VLDL 14 05/03/2016 1506   LDLCALC 118 05/03/2016 1506   HEMOGLOBIN A1C Lab Results  Component Value Date   HGBA1C 6.5 01/30/2018   MPG 151 04/07/2017   TSH No results for input(s): TSH in the last 8760 hours.  PRN Meds:. There are no discontinued medications. No outpatient medications have been marked as taking for the 06/11/19 encounter (Appointment) with Miquel Dunn, NP.    Cardiac Studies:  Assessment:   No diagnosis found.  EKG 05/21/2019: Normal sinus rhythm at 82 bpm, normal axis, PRWP, cannot exclude anterior infarct old. LVH with liekly repolarization abnormality, cannot exclude ischemia. Abnormal EKG.   Recommendations:   Patient with uncontrolled type 2 diabetes and uncontrolled hypertension presents for evaluation of chest pain and left arm pain. Patients symptoms are clearly suggestive of musculoskeletal etiology in that they are exacerbated and alleviated by position changes. Not associated with exertion. I have encouraged her to take ibuprofen/ tylenol as needed as well as continuing to use heat/ice to her chest wall. Blood pressure is markedly elevated as patient does not take her medications. I have stressed the importance of compliance with her medications and long term risk of uncontrolled hypertension including CKD, CVA, etc. She verbalized understanding. I will start her on amlodipine 10 mg daily along with Metoprolol 50 mg daily. EKG is abnormal and suggestive of hypertensive heart disease. I have recommended echocardiogram to exclude any structural abnormalities. She does have S4 present on exam, otherwise physical exam is unremarkable except for obesity.   Discussed the importance of weight loss to help with controlling her hypertension and diabetes. She will follow up with her PCP regarding her diabetes. I will see her  back in 3 weeks for follow up on hypertension.    *I have discussed this case with Dr. Jacinto HalimGanji and he personally examined the patient and participated in formulating the plan.*   Toniann FailAshton Haynes Emiliya Chretien, MSN, APRN, FNP-C [facility] [place] Office: [phone] Fax: [phone]

## 2019-06-21 ENCOUNTER — Other Ambulatory Visit: Payer: Self-pay

## 2019-06-21 ENCOUNTER — Ambulatory Visit (INDEPENDENT_AMBULATORY_CARE_PROVIDER_SITE_OTHER): Payer: Medicaid Other

## 2019-06-21 DIAGNOSIS — I10 Essential (primary) hypertension: Secondary | ICD-10-CM | POA: Diagnosis not present

## 2019-06-26 ENCOUNTER — Other Ambulatory Visit: Payer: Self-pay

## 2019-06-26 ENCOUNTER — Ambulatory Visit (INDEPENDENT_AMBULATORY_CARE_PROVIDER_SITE_OTHER): Payer: Medicaid Other | Admitting: Cardiology

## 2019-06-26 ENCOUNTER — Encounter: Payer: Self-pay | Admitting: Cardiology

## 2019-06-26 VITALS — BP 198/111 | HR 55 | Ht 70.0 in | Wt 257.0 lb

## 2019-06-26 DIAGNOSIS — N182 Chronic kidney disease, stage 2 (mild): Secondary | ICD-10-CM

## 2019-06-26 DIAGNOSIS — E1122 Type 2 diabetes mellitus with diabetic chronic kidney disease: Secondary | ICD-10-CM

## 2019-06-26 DIAGNOSIS — R079 Chest pain, unspecified: Secondary | ICD-10-CM

## 2019-06-26 DIAGNOSIS — I129 Hypertensive chronic kidney disease with stage 1 through stage 4 chronic kidney disease, or unspecified chronic kidney disease: Secondary | ICD-10-CM | POA: Diagnosis not present

## 2019-06-26 DIAGNOSIS — I10 Essential (primary) hypertension: Secondary | ICD-10-CM

## 2019-06-26 DIAGNOSIS — I429 Cardiomyopathy, unspecified: Secondary | ICD-10-CM | POA: Diagnosis not present

## 2019-06-26 DIAGNOSIS — E1165 Type 2 diabetes mellitus with hyperglycemia: Secondary | ICD-10-CM

## 2019-06-26 DIAGNOSIS — IMO0002 Reserved for concepts with insufficient information to code with codable children: Secondary | ICD-10-CM

## 2019-06-26 MED ORDER — CARVEDILOL 12.5 MG PO TABS: 12.5000 mg | ORAL_TABLET | Freq: Two times a day (BID) | ORAL | 1 refills | Status: DC

## 2019-06-26 MED ORDER — HYDRALAZINE HCL 50 MG PO TABS: 50.0000 mg | ORAL_TABLET | Freq: Three times a day (TID) | ORAL | 0 refills | Status: AC

## 2019-06-26 MED ORDER — ISOSORBIDE DINITRATE 30 MG PO TABS: 30.0000 mg | ORAL_TABLET | Freq: Three times a day (TID) | ORAL | 1 refills | Status: AC

## 2019-06-26 MED ORDER — CARVEDILOL 6.25 MG PO TABS: 6.2500 mg | ORAL_TABLET | Freq: Two times a day (BID) | ORAL | 1 refills | Status: AC

## 2019-06-26 NOTE — Progress Notes (Signed)
Primary Physician:  Bing NeighborsHarris, Kimberly S, FNP   Patient ID: Latoya Strickland, female    DOB: 08/19/1976, 43 y.o.   MRN: [id number]  Subjective:    Chief Complaint  Patient presents with  . Hypertension  . Arm Pain  . Follow-up    HPI: Latoya Strickland  is a 43 y.o. female  with hypertension, PCOS, DJD, recently evaluated by us for atypical chest pain and uncontrolled hypertension.   Patient was seen at [facility] on 05/01/2019 for chest pain associated with left arm radiation. She was noted to have EKG changes concerning for acute changes. Patient was advised that she needed to be seen in ER for further workup; however, patient did not feel comfortable going to ER and left AMA. She has continued to have intermittent chest pain that she feels radiates from her left arm. Chest pain can occur at rest and is improved with ice to her chest along with position changes. Also has chest tightness with sexual intercourse and exertion. Has history of non-compliance to medications as she does not like the way they make her feel. She was started on Metoprolol along with amlodipine and underwent echocardiogram and now presents for follow up.   She reports inability to pick up her prescriptions as she could not afford them. Symptoms remain unchanged.  She has history of diabetes; however, also does not take her medications for this as she does not believe that she truly has it. Denies any history of hyperlipidemia.   She is former tobacco user, quit around age 43. She has a 41 year old baby boy. Had preeclampsia with pregnancy. No alcohol use. She does not exercise, states that she is mostly sedentary.   Past Medical History:  Diagnosis Date  . Arthritis   . Blood transfusion without reported diagnosis   . Carpal tunnel syndrome   . Degenerative joint disease   . Diabetes mellitus without complication (HCC)   . DJD (degenerative joint disease)   . Hypertension   . Lumbar herniated disc   . PCOS  (polycystic ovarian syndrome)     Past Surgical History:  Procedure Laterality Date  . CESAREAN SECTION    . WISDOM TOOTH EXTRACTION      Social History   Socioeconomic History  . Marital status: Significant Other    Spouse name: Not on file  . Number of children: 1  . Years of education: Not on file  . Highest education level: Not on file  Occupational History  . Not on file  Social Needs  . Financial resource strain: Not on file  . Food insecurity    Worry: Not on file    Inability: Not on file  . Transportation needs    Medical: Not on file    Non-medical: Not on file  Tobacco Use  . Smoking status: Never Smoker  . Smokeless tobacco: Never Used  Substance and Sexual Activity  . Alcohol use: No  . Drug use: Yes    Types: Marijuana    Comment: 01/11/17 +THC  . Sexual activity: Yes    Birth control/protection: None  Lifestyle  . Physical activity    Days per week: Not on file    Minutes per session: Not on file  . Stress: Not on file  Relationships  . Social Musicianconnections    Talks on phone: Not on file    Gets together: Not on file    Attends religious service: Not on file    Active member  of club or organization: Not on file    Attends meetings of clubs or organizations: Not on file    Relationship status: Not on file  . Intimate partner violence    Fear of current or ex partner: Not on file    Emotionally abused: Not on file    Physically abused: Not on file    Forced sexual activity: Not on file  Other Topics Concern  . Not on file  Social History Narrative  . Not on file    Review of Systems  Constitution: Negative for decreased appetite, malaise/fatigue, weight gain and weight loss.  Eyes: Negative for visual disturbance.  Cardiovascular: Positive for chest pain (left arm pain). Negative for claudication, dyspnea on exertion, leg swelling, orthopnea, palpitations and syncope.  Respiratory: Negative for hemoptysis and wheezing.   Endocrine: Negative  for cold intolerance and heat intolerance.  Hematologic/Lymphatic: Does not bruise/bleed easily.  Skin: Negative for nail changes.  Musculoskeletal: Negative for muscle weakness and myalgias.  Gastrointestinal: Negative for abdominal pain, change in bowel habit, nausea and vomiting.  Neurological: Negative for difficulty with concentration, dizziness, focal weakness and headaches.  Psychiatric/Behavioral: Negative for altered mental status and suicidal ideas.  All other systems reviewed and are negative.     Objective:  Blood pressure (!) 198/111, pulse (!) 55, height 5\' 10"  (1.778 m), weight 257 lb (116.6 kg), SpO2 97 %. Body mass index is 36.88 kg/m.    Physical Exam  Constitutional: She is oriented to person, place, and time. Vital signs are normal. She appears well-developed and well-nourished.  HENT:  Head: Normocephalic and atraumatic.  Neck: Normal range of motion.  Cardiovascular: Normal rate, regular rhythm and intact distal pulses. Exam reveals gallop and S4.  Pulmonary/Chest: Effort normal and breath sounds normal. No accessory muscle usage. No respiratory distress.  Abdominal: Soft. Bowel sounds are normal.  Musculoskeletal: Normal range of motion.  Neurological: She is alert and oriented to person, place, and time.  Skin: Skin is warm and dry.  Vitals reviewed.  Radiology: No results found.  Laboratory examination:    CMP Latest Ref Rng & Units 01/30/2018 05/06/2017 04/07/2017  Glucose 65 - 99 mg/dL 147(W121(H) 295(A146(H) 213(Y100(H)  BUN 6 - 20 mg/dL 13 12 8   Creatinine 0.57 - 1.00 mg/dL 8.650.78 7.840.82 6.960.72  Sodium 134 - 144 mmol/L 141 133(L) 136  Potassium 3.5 - 5.2 mmol/L 4.5 3.8 4.2  Chloride 96 - 106 mmol/L 101 102 107  CO2 20 - 29 mmol/L 25 24 22   Calcium 8.7 - 10.2 mg/dL 9.9 2.9(B8.5(L) 2.8(U8.5(L)  Total Protein 6.0 - 8.5 g/dL 7.4 5.3(L) 6.1(L)  Total Bilirubin 0.0 - 1.2 mg/dL 0.7 0.8 1.0  Alkaline Phos 39 - 117 IU/L 64 100 66  AST 0 - 40 IU/L 10 17 15   ALT 0 - 32 IU/L 10 10(L)  11(L)   CBC Latest Ref Rng & Units 01/30/2018 05/06/2017 04/07/2017  WBC 3.4 - 10.8 x10E3/uL 6.3 7.3 7.8  Hemoglobin 11.1 - 15.9 g/dL 13.212.5 44.012.1 11.1(L)  Hematocrit 34.0 - 46.6 % 37.7 34.3(L) 31.6(L)  Platelets 150 - 379 x10E3/uL 319 222 221   Lipid Panel     Component Value Date/Time   CHOL 182 05/03/2016 1506   TRIG 71 05/03/2016 1506   HDL 50 05/03/2016 1506   CHOLHDL 3.6 05/03/2016 1506   VLDL 14 05/03/2016 1506   LDLCALC 118 05/03/2016 1506   HEMOGLOBIN A1C Lab Results  Component Value Date   HGBA1C 6.5 01/30/2018   MPG  151 04/07/2017   TSH No results for input(s): TSH in the last 8760 hours.  PRN Meds:. Medications Discontinued During This Encounter  Medication Reason  . albuterol (PROVENTIL HFA;VENTOLIN HFA) 108 (90 Base) MCG/ACT inhaler Error  . aspirin 325 MG EC tablet Error  . azithromycin (ZITHROMAX) 250 MG tablet Error  . amLODipine (NORVASC) 10 MG tablet Discontinued by provider  . carvedilol (COREG) 12.5 MG tablet Discontinued by provider  . metoprolol succinate (TOPROL-XL) 50 MG 24 hr tablet Discontinued by provider   Current Meds  Medication Sig  . acetaminophen (TYLENOL) 500 MG tablet Take 500 mg by mouth every 6 (six) hours as needed.    Cardiac Studies:   Echocardiogram 06/21/2019: Mildly depressed LV systolic function with EF 35%. Left ventricle cavity is normal in size. Moderate concentric hypertrophy of the left ventricle. Normal global wall motion. Doppler evidence of grade II (pseudonormal) diastolic dysfunction, elevated LAP. Calculated EF 35%. Left atrial cavity is moderately dilated.  Mild (Grade I) mitral regurgitation. Mild tricuspid regurgitation. Estimated pulmonary artery systolic pressure is 35 mmHg.   Assessment:     ICD-10-CM   1. Essential hypertension  I10   2. Cardiomyopathy due to hypertension, without heart failure ([place])  I11.9    I43   3. Exertional chest pain  R07.9   4. 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 LVH with liekly repolarization abnormality, cannot exclude ischemia. Abnormal EKG.   Recommendations:   Patient was unable to start medications due to cost. Blood pressure continues to be markedly elevated. I have discussed recently obtained echocardiogram results with the patient. She is noted to have mild to moderately depressed LVEF of 35%, no wall motion abnormalities. I suspect that cardiomyopathy is related to uncontrolled hypertension. She is fortunately without evidence or symptoms of heart failure. I have discussed that importance of controlling her blood pressure and that she has to make diet changes to help prevent heart failure exacerbation. Discussed portion control and low sodium diet. Due to financial concerns and inability to afford medications, I will start her on hydralazine 50 mg TID and Isosorbide dinitrate 30 mg TID. Will also add Coreg 6.25 mg BID. Advised her to contact me if she is unable to afford the medications.  Advised patient that she should not get pregnant at this time with her cardiomyopathy and uncontrolled hypertension.  I suspect patients chest pain that is both at rest and exertional is related to hypertension. Will consider stress testing once BP is controlled. She does have uncontrolled diabetes and will need follow up with PCP for management. I will see her back in 3 weeks for close monitoring of her blood pressure.  *I have discussed this case with Dr. Einar Gip and he personally examined the patient and participated in formulating the plan.*   Miquel Dunn, MSN, APRN, FNP-C [facility]. [place] Office: [phone] Fax: [phone]

## 2019-06-27 ENCOUNTER — Encounter: Payer: Self-pay | Admitting: Cardiology

## 2019-07-24 ENCOUNTER — Ambulatory Visit: Payer: Medicaid Other | Admitting: Cardiology

## 2019-07-24 IMAGING — US US PELVIS COMPLETE TRANSABD/TRANSVAG
1 series · 15 of 25 positions shown · non-contrast
Comparison: None

CLINICAL DATA: Patient with abdominal pain for 3 weeks.

EXAM:
TRANSABDOMINAL AND TRANSVAGINAL ULTRASOUND OF PELVIS
TECHNIQUE: Both transabdominal and transvaginal ultrasound examinations of the
pelvis were performed. Transabdominal technique was performed for
global imaging of the pelvis including uterus, ovaries, adnexal
regions, and pelvic cul-de-sac. It was necessary to proceed with
endovaginal exam following the transabdominal exam to visualize the
endometrium and adnexal structures.

[Series 1: us pelvis complete transabd/transvag · 15 of 93 slices shown]
[im 1/93]
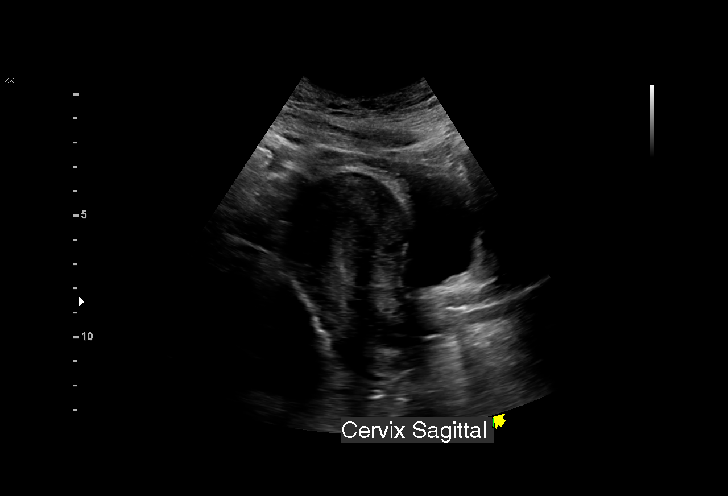
[im 8/93]
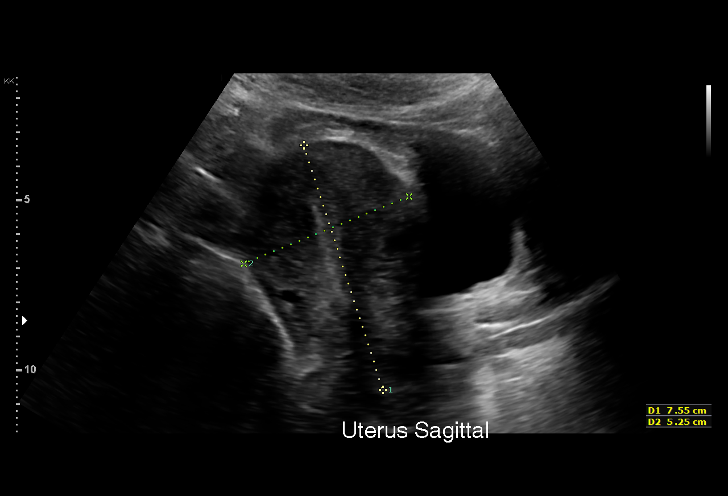
[im 16/93]
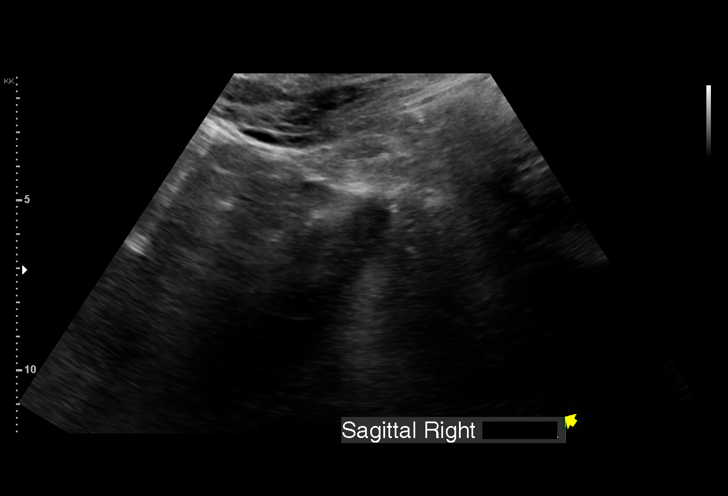
[im 20/93]
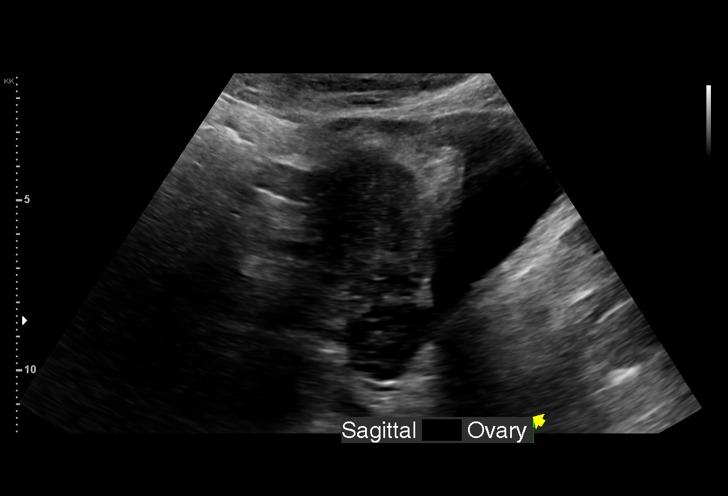
[im 27/93]
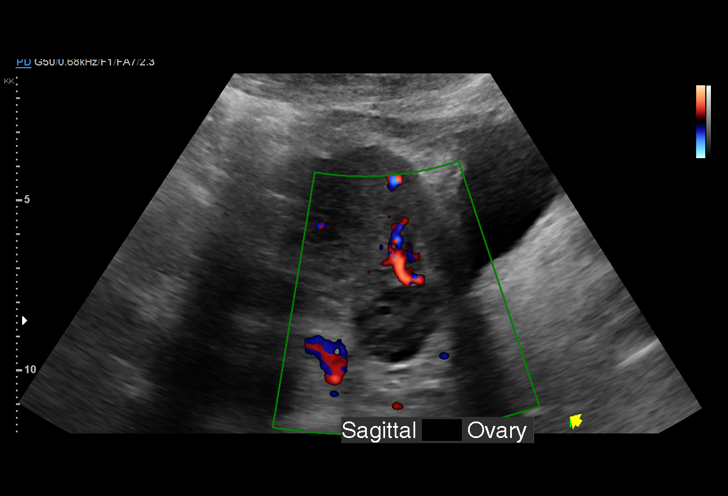
[im 35/93]
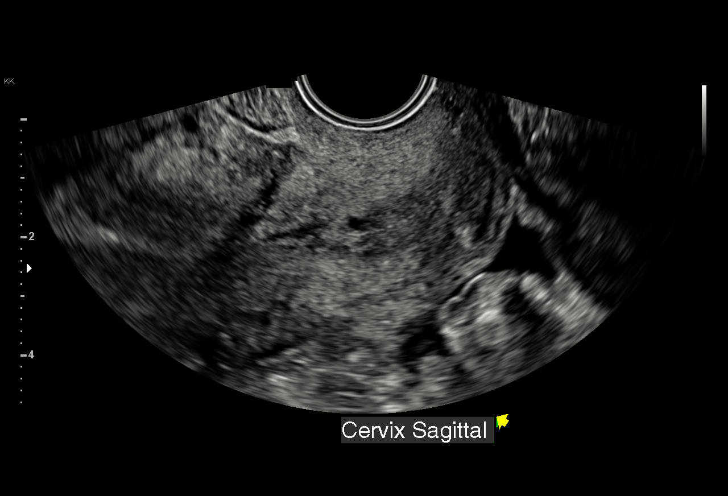
[im 39/93]
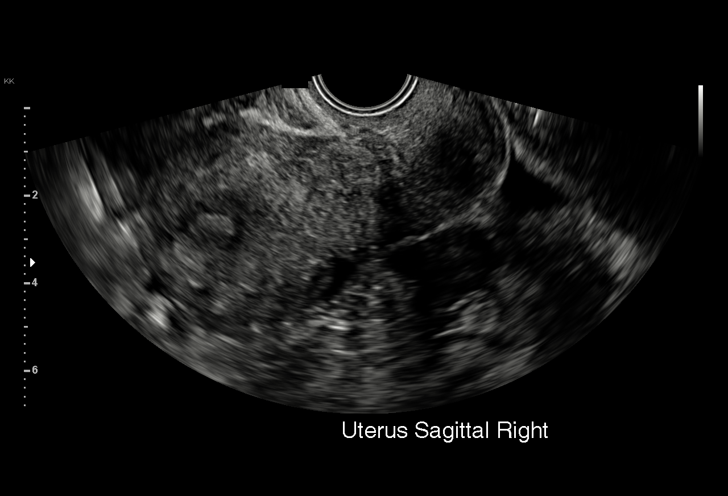
[im 47/93]
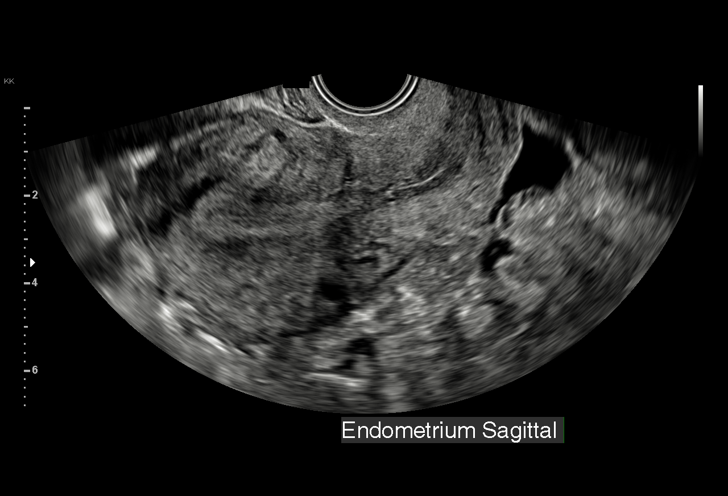
[im 54/93]
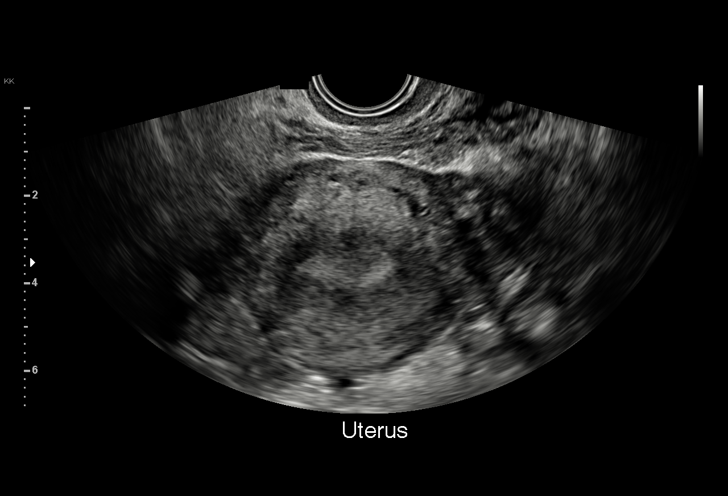
[im 58/93]
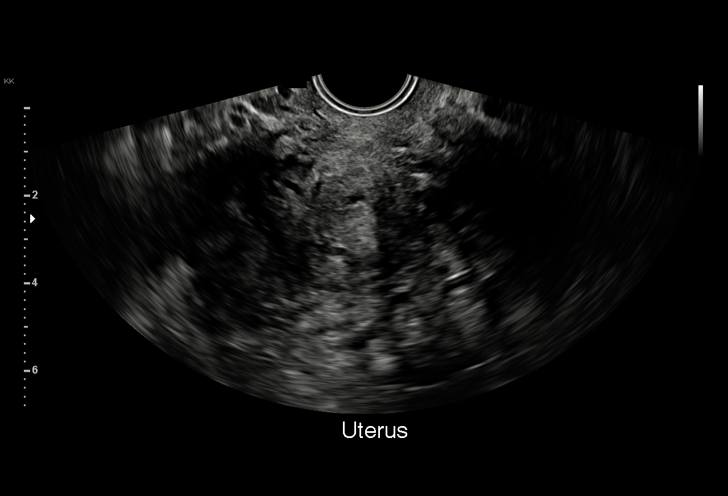
[im 66/93]
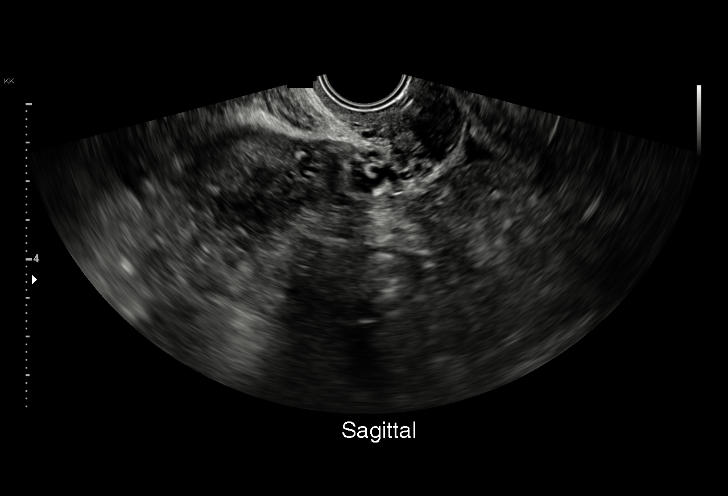
[im 73/93]
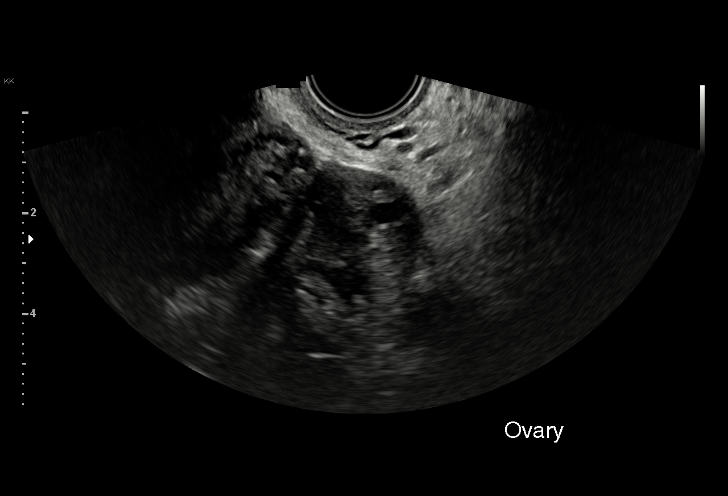
[im 77/93]
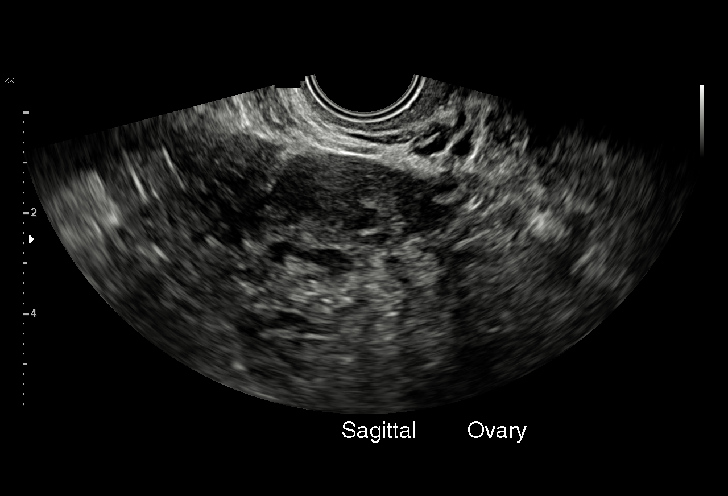
[im 85/93]
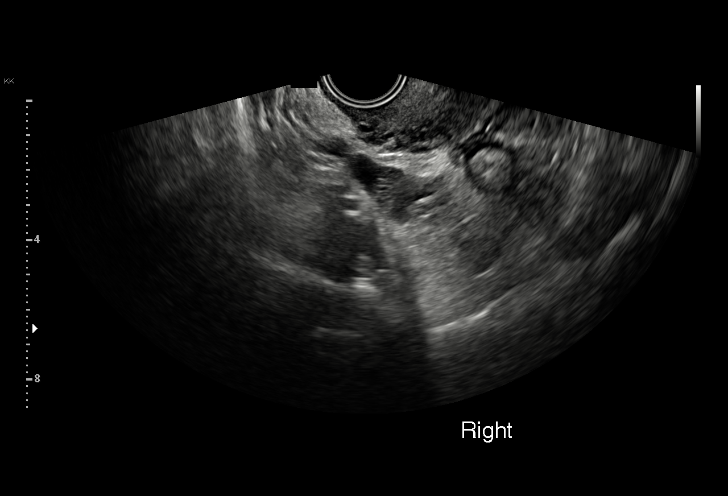
[im 93/93]
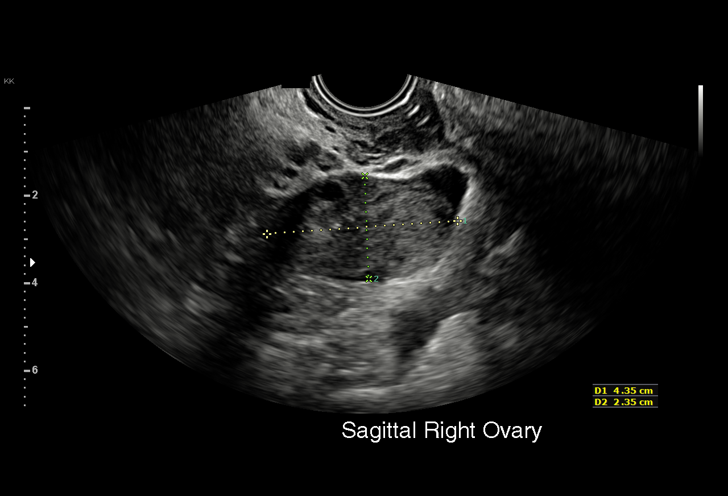

[15 of 25 positions shown; findings below may reference images not displayed]

FINDINGS: Uterus

Measurements: 9.3 x 5.1 x 5.7 cm. No fibroids or other mass
visualized.

Endometrium

Thickness: 12 mm.  No focal abnormality visualized.

Right ovary

Measurements: 4.4 x 2.4 x 3.3 cm. Normal appearance/no adnexal mass.
Resolving corpus luteum.

Left ovary

Measurements: 3.7 x 1.9 x 2.1 cm. Normal appearance/no adnexal mass.

Other findings

Small amount of free fluid.
IMPRESSION: No acute process within the pelvis.

## 2019-07-31 ENCOUNTER — Telehealth: Payer: Self-pay | Admitting: Cardiology

## 2019-07-31 ENCOUNTER — Ambulatory Visit: Payer: Medicaid Other | Admitting: Cardiology

## 2019-07-31 MED ORDER — NITROGLYCERIN 0.4 MG SL SUBL: 0.4000 mg | SUBLINGUAL_TABLET | SUBLINGUAL | 3 refills | Status: AC | PRN

## 2019-07-31 NOTE — Telephone Encounter (Signed)
Patient called to reschedule her appt. She continues to complain of exertional chest pain and chest pain during intercourse. She admits to not taking her medication regularly. I have made her aware of the importance of taking them as prescribed. Although I feel her chest pain is related to hypertension, I will send her a prescription for nitroglycerin to use as needed.  She was instructed on how to use. Can take up to 3 tablets , 15 min apart. If she is having to take regularly or chest pain does not resolve with use, advised going to ER. She will reschedule her appt.

## 2019-07-31 NOTE — Progress Notes (Deleted)
Primary Physician:  Bing NeighborsHarris, Kimberly S, FNP   Patient ID: Latoya Strickland, female    DOB: 05/15/1976, 43 y.o.   MRN: [id number]  Subjective:    No chief complaint on file.   HPI: Latoya Strickland  is a 43 y.o. female  with hypertension, PCOS, DJD, cardiomyopathy with LVEF of 35% by echo on 06/21/19 felt to be related to uncontrolled hypertension.  She was started on Coreg 6.25 mg BID and Hydralazine and Isosorbide dinitrate at her last office visit and now presents for follow up.  She has continued to have intermittent chest pain that she feels radiates from her left arm. Chest pain can occur at rest and is improved with ice to her chest along with position changes. Also has chest tightness with sexual intercourse and exertion. Has history of non-compliance to medications as she does not like the way they make her feel.   She has history of diabetes; however, also does not take her medications for this as she does not believe that she truly has it. Denies any history of hyperlipidemia.   She is former tobacco user, quit around age 43. She has a 706 year old baby boy. Had preeclampsia with pregnancy. No alcohol use. She does not exercise, states that she is mostly sedentary.   Past Medical History:  Diagnosis Date  . Arthritis   . Blood transfusion without reported diagnosis   . Carpal tunnel syndrome   . Degenerative joint disease   . Diabetes mellitus without complication (HCC)   . DJD (degenerative joint disease)   . Hypertension   . Lumbar herniated disc   . PCOS (polycystic ovarian syndrome)     Past Surgical History:  Procedure Laterality Date  . CESAREAN SECTION    . WISDOM TOOTH EXTRACTION      Social History   Socioeconomic History  . Marital status: Significant Other    Spouse name: Not on file  . Number of children: 1  . Years of education: Not on file  . Highest education level: Not on file  Occupational History  . Not on file  Social Needs  . Financial  resource strain: Not on file  . Food insecurity    Worry: Not on file    Inability: Not on file  . Transportation needs    Medical: Not on file    Non-medical: Not on file  Tobacco Use  . Smoking status: Never Smoker  . Smokeless tobacco: Never Used  Substance and Sexual Activity  . Alcohol use: No  . Drug use: Yes    Types: Marijuana    Comment: 01/11/17 +THC  . Sexual activity: Yes    Birth control/protection: None  Lifestyle  . Physical activity    Days per week: Not on file    Minutes per session: Not on file  . Stress: Not on file  Relationships  . Social Musicianconnections    Talks on phone: Not on file    Gets together: Not on file    Attends religious service: Not on file    Active member of club or organization: Not on file    Attends meetings of clubs or organizations: Not on file    Relationship status: Not on file  . Intimate partner violence    Fear of current or ex partner: Not on file    Emotionally abused: Not on file    Physically abused: Not on file    Forced sexual activity: Not on file  Other Topics  Concern  . Not on file  Social History Narrative  . Not on file    Review of Systems  Constitution: Negative for decreased appetite, malaise/fatigue, weight gain and weight loss.  Eyes: Negative for visual disturbance.  Cardiovascular: Positive for chest pain (left arm pain). Negative for claudication, dyspnea on exertion, leg swelling, orthopnea, palpitations and syncope.  Respiratory: Negative for hemoptysis and wheezing.   Endocrine: Negative for cold intolerance and heat intolerance.  Hematologic/Lymphatic: Does not bruise/bleed easily.  Skin: Negative for nail changes.  Musculoskeletal: Negative for muscle weakness and myalgias.  Gastrointestinal: Negative for abdominal pain, change in bowel habit, nausea and vomiting.  Neurological: Negative for difficulty with concentration, dizziness, focal weakness and headaches.  Psychiatric/Behavioral: Negative  for altered mental status and suicidal ideas.  All other systems reviewed and are negative.     Objective:  There were no vitals taken for this visit. There is no height or weight on file to calculate BMI.    Physical Exam  Constitutional: She is oriented to person, place, and time. Vital signs are normal. She appears well-developed and well-nourished.  HENT:  Head: Normocephalic and atraumatic.  Neck: Normal range of motion.  Cardiovascular: Normal rate, regular rhythm and intact distal pulses. Exam reveals gallop and S4.  Pulmonary/Chest: Effort normal and breath sounds normal. No accessory muscle usage. No respiratory distress.  Abdominal: Soft. Bowel sounds are normal.  Musculoskeletal: Normal range of motion.  Neurological: She is alert and oriented to person, place, and time.  Skin: Skin is warm and dry.  Vitals reviewed.  Radiology: No results found.  Laboratory examination:    CMP Latest Ref Rng & Units 01/30/2018 05/06/2017 04/07/2017  Glucose 65 - 99 mg/dL 161(W121(H) 960(A146(H) 540(J100(H)  BUN 6 - 20 mg/dL 13 12 8   Creatinine 0.57 - 1.00 mg/dL 8.110.78 9.140.82 7.820.72  Sodium 134 - 144 mmol/L 141 133(L) 136  Potassium 3.5 - 5.2 mmol/L 4.5 3.8 4.2  Chloride 96 - 106 mmol/L 101 102 107  CO2 20 - 29 mmol/L 25 24 22   Calcium 8.7 - 10.2 mg/dL 9.9 9.5(A8.5(L) 2.1(H8.5(L)  Total Protein 6.0 - 8.5 g/dL 7.4 5.3(L) 6.1(L)  Total Bilirubin 0.0 - 1.2 mg/dL 0.7 0.8 1.0  Alkaline Phos 39 - 117 IU/L 64 100 66  AST 0 - 40 IU/L 10 17 15   ALT 0 - 32 IU/L 10 10(L) 11(L)   CBC Latest Ref Rng & Units 01/30/2018 05/06/2017 04/07/2017  WBC 3.4 - 10.8 x10E3/uL 6.3 7.3 7.8  Hemoglobin 11.1 - 15.9 g/dL 08.612.5 57.812.1 11.1(L)  Hematocrit 34.0 - 46.6 % 37.7 34.3(L) 31.6(L)  Platelets 150 - 379 x10E3/uL 319 222 221   Lipid Panel     Component Value Date/Time   CHOL 182 05/03/2016 1506   TRIG 71 05/03/2016 1506   HDL 50 05/03/2016 1506   CHOLHDL 3.6 05/03/2016 1506   VLDL 14 05/03/2016 1506   LDLCALC 118 05/03/2016 1506    HEMOGLOBIN A1C Lab Results  Component Value Date   HGBA1C 6.5 01/30/2018   MPG 151 04/07/2017   TSH No results for input(s): TSH in the last 8760 hours.  PRN Meds:. Medications Discontinued During This Encounter  Medication Reason  . acetaminophen (TYLENOL) 500 MG tablet   . glyBURIDE (DIABETA) 2.5 MG tablet   . metFORMIN (GLUCOPHAGE) 500 MG tablet    No outpatient medications have been marked as taking for the 07/31/19 encounter (Appointment) with Toniann FailKelley, Rafael Quesada Haynes, NP.    Cardiac Studies:   Echocardiogram 06/21/2019:  Mildly depressed LV systolic function with EF 35%. Left ventricle cavity is normal in size. Moderate concentric hypertrophy of the left ventricle. Normal global wall motion. Doppler evidence of grade II (pseudonormal) diastolic dysfunction, elevated LAP. Calculated EF 35%. Left atrial cavity is moderately dilated.  Mild (Grade I) mitral regurgitation. Mild tricuspid regurgitation. Estimated pulmonary artery systolic pressure is 35 mmHg.   Assessment:   No diagnosis found.  EKG 05/21/2019: Normal sinus rhythm at 82 bpm, normal axis, PRWP, cannot exclude anterior infarct old. LVH with liekly repolarization abnormality, cannot exclude ischemia. Abnormal EKG.   Recommendations:   Patient was unable to start medications due to cost. Blood pressure continues to be markedly elevated. I have discussed recently obtained echocardiogram results with the patient. She is noted to have mild to moderately depressed LVEF of 35%, no wall motion abnormalities. I suspect that cardiomyopathy is related to uncontrolled hypertension. She is fortunately without evidence or symptoms of heart failure. I have discussed that importance of controlling her blood pressure and that she has to make diet changes to help prevent heart failure exacerbation. Discussed portion control and low sodium diet. Due to financial concerns and inability to afford medications, I will start her on hydralazine  50 mg TID and Isosorbide dinitrate 30 mg TID. Will also add Coreg 6.25 mg BID. Advised her to contact me if she is unable to afford the medications.  Advised patient that she should not get pregnant at this time with her cardiomyopathy and uncontrolled hypertension.  I suspect patients chest pain that is both at rest and exertional is related to hypertension. Will consider stress testing once BP is controlled. She does have uncontrolled diabetes and will need follow up with PCP for management. I will see her back in 3 weeks for close monitoring of her blood pressure.  *I have discussed this case with Dr. Einar Gip and he personally examined the patient and participated in formulating the plan.*   Miquel Dunn, MSN, APRN, FNP-C [facility]. [place] Office: [phone] Fax: [phone]

## 2019-08-14 ENCOUNTER — Ambulatory Visit: Payer: Medicaid Other | Admitting: Cardiology

## 2019-08-14 NOTE — Progress Notes (Deleted)
Primary Physician:  Bing Neighbors, FNP   Patient ID: Latoya Strickland, female    DOB: 03/01/1976, 43 y.o.   MRN: [id number]  Subjective:    No chief complaint on file.   HPI: Latoya Strickland  is a 43 y.o. female  with hypertension, PCOS, DJD, cardiomyopathy with LVEF of 35% by echo on 06/21/19 felt to be related to uncontrolled hypertension.  She was started on Coreg 6.25 mg BID and Hydralazine and Isosorbide dinitrate at her last office visit and now presents for follow up.  She has continued to have intermittent chest pain that she feels radiates from her left arm. Chest pain can occur at rest and is improved with ice to her chest along with position changes. Also has chest tightness with sexual intercourse and exertion. Has history of non-compliance to medications as she does not like the way they make her feel.   She has history of diabetes; however, also does not take her medications for this as she does not believe that she truly has it. Denies any history of hyperlipidemia.   She is former tobacco user, quit around age 17. She has a 66 year old baby boy. Had preeclampsia with pregnancy. No alcohol use. She does not exercise, states that she is mostly sedentary.   Past Medical History:  Diagnosis Date  . Arthritis   . Blood transfusion without reported diagnosis   . Carpal tunnel syndrome   . Degenerative joint disease   . Diabetes mellitus without complication (HCC)   . DJD (degenerative joint disease)   . Hypertension   . Lumbar herniated disc   . PCOS (polycystic ovarian syndrome)     Past Surgical History:  Procedure Laterality Date  . CESAREAN SECTION    . WISDOM TOOTH EXTRACTION      Social History   Socioeconomic History  . Marital status: Significant Other    Spouse name: Not on file  . Number of children: 1  . Years of education: Not on file  . Highest education level: Not on file  Occupational History  . Not on file  Social Needs  . Financial  resource strain: Not on file  . Food insecurity    Worry: Not on file    Inability: Not on file  . Transportation needs    Medical: Not on file    Non-medical: Not on file  Tobacco Use  . Smoking status: Never Smoker  . Smokeless tobacco: Never Used  Substance and Sexual Activity  . Alcohol use: No  . Drug use: Yes    Types: Marijuana    Comment: 01/11/17 +THC  . Sexual activity: Yes    Birth control/protection: None  Lifestyle  . Physical activity    Days per week: Not on file    Minutes per session: Not on file  . Stress: Not on file  Relationships  . Social Musician on phone: Not on file    Gets together: Not on file    Attends religious service: Not on file    Active member of club or organization: Not on file    Attends meetings of clubs or organizations: Not on file    Relationship status: Not on file  . Intimate partner violence    Fear of current or ex partner: Not on file    Emotionally abused: Not on file    Physically abused: Not on file    Forced sexual activity: Not on file  Other Topics  Concern  . Not on file  Social History Narrative  . Not on file    Review of Systems  Constitution: Negative for decreased appetite, malaise/fatigue, weight gain and weight loss.  Eyes: Negative for visual disturbance.  Cardiovascular: Positive for chest pain (left arm pain). Negative for claudication, dyspnea on exertion, leg swelling, orthopnea, palpitations and syncope.  Respiratory: Negative for hemoptysis and wheezing.   Endocrine: Negative for cold intolerance and heat intolerance.  Hematologic/Lymphatic: Does not bruise/bleed easily.  Skin: Negative for nail changes.  Musculoskeletal: Negative for muscle weakness and myalgias.  Gastrointestinal: Negative for abdominal pain, change in bowel habit, nausea and vomiting.  Neurological: Negative for difficulty with concentration, dizziness, focal weakness and headaches.  Psychiatric/Behavioral: Negative  for altered mental status and suicidal ideas.  All other systems reviewed and are negative.     Objective:  There were no vitals taken for this visit. There is no height or weight on file to calculate BMI.    Physical Exam  Constitutional: She is oriented to person, place, and time. Vital signs are normal. She appears well-developed and well-nourished.  HENT:  Head: Normocephalic and atraumatic.  Neck: Normal range of motion.  Cardiovascular: Normal rate, regular rhythm and intact distal pulses. Exam reveals gallop and S4.  Pulmonary/Chest: Effort normal and breath sounds normal. No accessory muscle usage. No respiratory distress.  Abdominal: Soft. Bowel sounds are normal.  Musculoskeletal: Normal range of motion.  Neurological: She is alert and oriented to person, place, and time.  Skin: Skin is warm and dry.  Vitals reviewed.  Radiology: No results found.  Laboratory examination:    CMP Latest Ref Rng & Units 01/30/2018 05/06/2017 04/07/2017  Glucose 65 - 99 mg/dL 121(H) 146(H) 100(H)  BUN 6 - 20 mg/dL 13 12 8   Creatinine 0.57 - 1.00 mg/dL 0.78 0.82 0.72  Sodium 134 - 144 mmol/L 141 133(L) 136  Potassium 3.5 - 5.2 mmol/L 4.5 3.8 4.2  Chloride 96 - 106 mmol/L 101 102 107  CO2 20 - 29 mmol/L 25 24 22   Calcium 8.7 - 10.2 mg/dL 9.9 8.5(L) 8.5(L)  Total Protein 6.0 - 8.5 g/dL 7.4 5.3(L) 6.1(L)  Total Bilirubin 0.0 - 1.2 mg/dL 0.7 0.8 1.0  Alkaline Phos 39 - 117 IU/L 64 100 66  AST 0 - 40 IU/L 10 17 15   ALT 0 - 32 IU/L 10 10(L) 11(L)   CBC Latest Ref Rng & Units 01/30/2018 05/06/2017 04/07/2017  WBC 3.4 - 10.8 x10E3/uL 6.3 7.3 7.8  Hemoglobin 11.1 - 15.9 g/dL 12.5 12.1 11.1(L)  Hematocrit 34.0 - 46.6 % 37.7 34.3(L) 31.6(L)  Platelets 150 - 379 x10E3/uL 319 222 221   Lipid Panel     Component Value Date/Time   CHOL 182 05/03/2016 1506   TRIG 71 05/03/2016 1506   HDL 50 05/03/2016 1506   CHOLHDL 3.6 05/03/2016 1506   VLDL 14 05/03/2016 1506   LDLCALC 118 05/03/2016 1506    HEMOGLOBIN A1C Lab Results  Component Value Date   HGBA1C 6.5 01/30/2018   MPG 151 04/07/2017   TSH No results for input(s): TSH in the last 8760 hours.  PRN Meds:. There are no discontinued medications. No outpatient medications have been marked as taking for the 08/14/19 encounter (Appointment) with Miquel Dunn, NP.    Cardiac Studies:   Echocardiogram 06/21/2019: Mildly depressed LV systolic function with EF 35%. Left ventricle cavity is normal in size. Moderate concentric hypertrophy of the left ventricle. Normal global wall motion. Doppler evidence of  grade II (pseudonormal) diastolic dysfunction, elevated LAP. Calculated EF 35%. Left atrial cavity is moderately dilated.  Mild (Grade I) mitral regurgitation. Mild tricuspid regurgitation. Estimated pulmonary artery systolic pressure is 35 mmHg.   Assessment:   No diagnosis found.  EKG 05/21/2019: Normal sinus rhythm at 82 bpm, normal axis, PRWP, cannot exclude anterior infarct old. LVH with liekly repolarization abnormality, cannot exclude ischemia. Abnormal EKG.   Recommendations:   Patient was unable to start medications due to cost. Blood pressure continues to be markedly elevated. I have discussed recently obtained echocardiogram results with the patient. She is noted to have mild to moderately depressed LVEF of 35%, no wall motion abnormalities. I suspect that cardiomyopathy is related to uncontrolled hypertension. She is fortunately without evidence or symptoms of heart failure. I have discussed that importance of controlling her blood pressure and that she has to make diet changes to help prevent heart failure exacerbation. Discussed portion control and low sodium diet. Due to financial concerns and inability to afford medications, I will start her on hydralazine 50 mg TID and Isosorbide dinitrate 30 mg TID. Will also add Coreg 6.25 mg BID. Advised her to contact me if she is unable to afford the medications.   Advised patient that she should not get pregnant at this time with her cardiomyopathy and uncontrolled hypertension.  I suspect patients chest pain that is both at rest and exertional is related to hypertension. Will consider stress testing once BP is controlled. She does have uncontrolled diabetes and will need follow up with PCP for management. I will see her back in 3 weeks for close monitoring of her blood pressure.  *I have discussed this case with Dr. Jacinto HalimGanji and he personally examined the patient and participated in formulating the plan.*   Toniann FailAshton Haynes Xaviar Lunn, MSN, APRN, FNP-C [facility] Office: [phone] Fax: [phone]

## 2020-03-26 ENCOUNTER — Ambulatory Visit: Payer: Self-pay | Admitting: Family Medicine

## 2021-11-05 ENCOUNTER — Telehealth (HOSPITAL_COMMUNITY): Payer: Self-pay

## 2021-11-05 ENCOUNTER — Encounter (HOSPITAL_COMMUNITY): Payer: Self-pay

## 2021-11-05 NOTE — Telephone Encounter (Signed)
Attempted to call patient in regards to Cardiac Rehab - unable to leave VM.   Mailed letter 

## 2021-11-27 ENCOUNTER — Telehealth (HOSPITAL_COMMUNITY): Payer: Self-pay

## 2021-11-27 NOTE — Telephone Encounter (Signed)
No response from pt.  Closed referral
